# Patient Record
Sex: Female | Born: 1970 | Race: White | Hispanic: No | Marital: Married | Smoking: Current every day smoker
Health system: Southern US, Academic
[De-identification: ages and names within clinical notes are randomized; demographics above are authoritative.]

## PROBLEM LIST (undated history)

## (undated) DIAGNOSIS — E119 Type 2 diabetes mellitus without complications: Secondary | ICD-10-CM

## (undated) DIAGNOSIS — C801 Malignant (primary) neoplasm, unspecified: Secondary | ICD-10-CM

## (undated) DIAGNOSIS — K219 Gastro-esophageal reflux disease without esophagitis: Secondary | ICD-10-CM

## (undated) DIAGNOSIS — G43909 Migraine, unspecified, not intractable, without status migrainosus: Secondary | ICD-10-CM

## (undated) DIAGNOSIS — J45909 Unspecified asthma, uncomplicated: Secondary | ICD-10-CM

## (undated) HISTORY — DX: Type 2 diabetes mellitus without complications: E11.9

## (undated) HISTORY — DX: Malignant (primary) neoplasm, unspecified: C80.1

## (undated) HISTORY — DX: Migraine, unspecified, not intractable, without status migrainosus: G43.909

## (undated) HISTORY — PX: LAPAROSCOPIC CHOLECYSTECTOMY: SUR755

## (undated) HISTORY — DX: Unspecified asthma, uncomplicated: J45.909

## (undated) HISTORY — DX: Gastro-esophageal reflux disease without esophagitis: K21.9

## (undated) HISTORY — PX: SKIN CANCER EXCISION: SHX779

---

## 2015-07-16 ENCOUNTER — Emergency Department (HOSPITAL_COMMUNITY): Payer: Self-pay | Admitting: Emergency Medicine

## 2022-07-16 ENCOUNTER — Ambulatory Visit (INDEPENDENT_AMBULATORY_CARE_PROVIDER_SITE_OTHER): Payer: BC Managed Care – PPO | Admitting: OTOLARYNGOLOGY

## 2022-07-16 ENCOUNTER — Encounter (INDEPENDENT_AMBULATORY_CARE_PROVIDER_SITE_OTHER): Payer: Self-pay | Admitting: OTOLARYNGOLOGY

## 2022-07-16 ENCOUNTER — Other Ambulatory Visit: Payer: Self-pay

## 2022-07-16 VITALS — Ht 60.0 in | Wt 170.0 lb

## 2022-07-16 DIAGNOSIS — H9193 Unspecified hearing loss, bilateral: Secondary | ICD-10-CM

## 2022-07-16 DIAGNOSIS — H6983 Other specified disorders of Eustachian tube, bilateral: Secondary | ICD-10-CM

## 2022-07-16 DIAGNOSIS — H6521 Chronic serous otitis media, right ear: Secondary | ICD-10-CM

## 2022-07-16 DIAGNOSIS — H9201 Otalgia, right ear: Secondary | ICD-10-CM

## 2022-07-16 MED ORDER — AZELASTINE 137 MCG (0.1 %) NASAL SPRAY AEROSOL
2.00 | INHALATION_SPRAY | Freq: Two times a day (BID) | NASAL | 5 refills | Status: AC
Start: 2022-07-16 — End: ?

## 2022-07-16 NOTE — Procedures (Signed)
ENT, [place]  [address]  [place] [address]    Procedure Note    Name: Bethany Chavez MRN:  [id number]   Date: 07/16/2022 Age: 51 y.o.       [address] - NASAL ENDOSCOPY DIAGNOSTIC UNILATERAL OR BILATERAL (AMB ONLY)  Performed by: Gillermo Murdoch, DO  Authorized by: Gillermo Murdoch, DO     Time Out:     Immediately before the procedure, a time out was called:  Yes    Patient verified:  Yes    Procedure Verified:  Yes    Site Verified:  Yes  Indications for procedure: Otalgia and Unilateral middle ear effusion    Anesthesia: Oxymetazoline nasal spray    Description: Nasal endoscopy with rigid scope was performed with examination of the  septum, inferior, middle, and superior meatus, turbinates, sphenoethmoidal recess, and nasopharynx.     There were no polyps, pus, or granulation tissue noted.  ET orifices and nasopharynx were normal.     Findings: Septal deviation, Inferior turbinate hypertrophy, and Allergic rhinitis    The patient tolerated the procedure well.        2  Gillermo Murdoch, DO

## 2022-07-16 NOTE — H&P (Signed)
[facility], [place]  [place] [address]  Phone: [phone]  Fax: [phone]      Encounter Date: 07/16/2022    Patient ID: KALLAN BISCHOFF  MRN: [id number]    DOB: Jul 31, 1971  Age: 51 y.o. female        Referring Provider:    No referring provider defined for this encounter.    Reason for Visit:   Chief Complaint   Patient presents with    Ear Problem(s)     New patient here for fluid/drainage in right ear since Oct 2022. Tried abx and steroids. Reports having RMT done in 2017-2018. Reports seeing 2 ENTs, one which punctured TM and suctioned out fluid but hole closed back up.        History of Present Illness:  Bethany Chavez is a 51 y.o. female who is referred for eval of ears. Pt has chronic ETD with h/o multiple BMT, c/o right otorrhea in Oct 2022, along with Strep. She saw some blood as well. The otorrhea has stopped but she cannot hear well. Asso with interm otalgia. Completed abx and steroids without resolution. She saw an ENT in [place] 2 months ago who rec sinus surgery , and said he couldn't place a tube d/t severe TM retraction. Had a Sinus CT at [facility], [place].     Tympanogram: AD Type B      AS Type A      Patient History:  There is no problem list on file for this patient.    Current Outpatient Medications   Medication Sig    albuterol sulfate (PROVENTIL OR VENTOLIN OR PROAIR) 90 mcg/actuation Inhalation oral inhaler Take 2 Puffs by mouth Every 4 hours as needed    azelastine (ASTELIN) 137 mcg (0.1 %) Nasal Aerosol, Spray Administer 2 Sprays into each nostril Twice daily Use in each nostril as directed    cetirizine (ZYRTEC) 10 mg Oral Tablet Take 1 Tablet (10 mg total) by mouth Once a day    EFFER-K 25 mEq Oral Tablet, Effervescent DISSOLVE 1 TABLET IN WATER AND drink TWICE DAILY WITH MEALS    famotidine (PEPCID) 40 mg Oral Tablet TAKE 1 TABLET BY MOUTH EVERY DAY AT BEDTIME FOR 30 DAYS.    gabapentin (NEURONTIN) 100 mg Oral Capsule TAKE 1  CAPSULE BY MOUTH EVERY 8 HOURS - MAY MAKE DROWSY increased dose    glipiZIDE (GLUCOTROL) 5 mg Oral Tablet TAKE 1 TABLET BY MOUTH TWICE DAILY AS DIRECTED    hydroCHLOROthiazide (HYDRODIURIL) 25 mg Oral Tablet Take 1 Tablet (25 mg total) by mouth Once a day as directed    lansoprazole (PREVACID) 30 mg Oral Capsule, Delayed Release(E.C.) TAKE 1 CAPSULE BY MOUTH EVERY DAY AS DIRECTED FOR 30 DAYS.    metFORMIN (GLUCOPHAGE) 500 mg Oral Tablet TAKE 1 TABLET BY MOUTH TWICE DAILY AS DIRECTED    montelukast (SINGULAIR) 10 mg Oral Tablet TAKE 1 TABLET EVERY DAY AS DIRECTED     Allergies   Allergen Reactions    Amoxicillin     Cephalexin     Cipro [Ciprofloxacin Hcl]     Risamine [Menthol-Zinc Oxide]     Wellbutrin [Bupropion]      Past Medical History:   Diagnosis Date    Asthma     Cancer (CMS HCC)     Diabetes mellitus (CMS [place])     Esophageal reflux     Migraine       Past Surgical History:   Procedure Laterality  Date    LAPAROSCOPIC CHOLECYSTECTOMY      SKIN CANCER EXCISION        Family Medical History:       Problem Relation (Age of Onset)    Cancer Other    Diabetes Other    Hypertension (High Blood Pressure) Other    Migraines Other    Thyroid Disease Other            Social History     Tobacco Use    Smoking status: Every Day     Packs/day: 2.00     Years: 39.00     Pack years: 78.00     Types: Cigarettes    Smokeless tobacco: Never   Substance Use Topics    Alcohol use: Not Currently    Drug use: Never       Review of Systems:  Review of Systems    Physical Exam:  Ht 1.524 m (5')   Wt 77.1 kg (170 lb)   BMI 33.20 kg/m       Physical Exam  Constitutional:       Appearance: Normal appearance. She is well-developed, well-groomed and normal weight.   HENT:      Head: Normocephalic and atraumatic.      Right Ear: Hearing, ear canal and external ear normal. A middle ear effusion is present. Tympanic membrane is retracted.      Left Ear: Hearing, ear canal and external ear normal. Tympanic membrane is scarred.      Nose:  Septal deviation and mucosal edema present.      Right Turbinates: Enlarged.      Left Turbinates: Enlarged.      Mouth/Throat:      Lips: Pink.      Mouth: Mucous membranes are moist.      Pharynx: Oropharynx is clear. Uvula midline.   Eyes:      Extraocular Movements: Extraocular movements intact.   Neck:      Trachea: Phonation normal.   Pulmonary:      Effort: Pulmonary effort is normal.   Musculoskeletal:      Cervical back: Normal range of motion and neck supple.   Lymphadenopathy:      Cervical: No cervical adenopathy.   Skin:     General: Skin is warm.   Neurological:      Mental Status: She is alert and oriented to person, place, and time.      Cranial Nerves: Cranial nerves 2-12 are intact. No facial asymmetry.   Psychiatric:         Attention and Perception: Attention normal.         Mood and Affect: Mood normal.         Speech: Speech normal.         Behavior: Behavior normal. Behavior is cooperative.       Assessment:  ENCOUNTER DIAGNOSES     ICD-10-CM   1. ETD (Eustachian tube dysfunction), bilateral  H69.83   2. Right chronic serous otitis media  H65.21   3. Unspecified hearing loss, bilateral  H91.93   4. Otalgia of right ear  H92.01       Plan:  Medical records reviewed on 07/16/2022.  Will order audiogram. Discussed RMT. Requested Sinus CT reports.   Rx Astelin nasal spray  Nasal saline b.i.d.  Orders Placed This Encounter    [phone] - NASAL ENDOSCOPY DIAGNOSTIC UNILATERAL OR BILATERAL (AMB ONLY)    AMB PRN REFERRAL EXTERNAL AUDIOLOGIST    POCT HEARING/VISION/TYMPANOGRAM (  AMB ONLY)    azelastine (ASTELIN) 137 mcg (0.1 %) Nasal Aerosol, Spray     Return for Review of audiogram and in office right myringotomy with tube placement on next procedure day.    The advanced practice clinician's documentation was reviewed/amended in its entirety with the assessment and plan portion completely performed independently by me during this encounter.    Gillermo Murdoch, DO      Bethany Dad, PA-C  07/16/2022, 09:58

## 2022-07-21 ENCOUNTER — Telehealth (INDEPENDENT_AMBULATORY_CARE_PROVIDER_SITE_OTHER): Payer: Self-pay | Admitting: OTOLARYNGOLOGY

## 2022-07-21 NOTE — Telephone Encounter (Signed)
Lm for pt to return call this morning. Lm for pt to return call just now on number listed in chart

## 2022-07-21 NOTE — Telephone Encounter (Signed)
Spoke to patient, she has insurance taken care of. Scheduled appt for audio and tube. Unable to do 8/25 or 8/28

## 2022-07-21 NOTE — Telephone Encounter (Signed)
[phone] said she never received a call concerning tube being placed

## 2022-08-01 ENCOUNTER — Encounter (INDEPENDENT_AMBULATORY_CARE_PROVIDER_SITE_OTHER): Payer: Self-pay | Admitting: OTOLARYNGOLOGY

## 2022-08-01 ENCOUNTER — Other Ambulatory Visit: Payer: Self-pay

## 2022-08-01 ENCOUNTER — Ambulatory Visit (INDEPENDENT_AMBULATORY_CARE_PROVIDER_SITE_OTHER): Payer: BC Managed Care – PPO | Admitting: OTOLARYNGOLOGY

## 2022-08-01 VITALS — Resp 16 | Wt 170.0 lb

## 2022-08-01 DIAGNOSIS — H9201 Otalgia, right ear: Secondary | ICD-10-CM

## 2022-08-01 DIAGNOSIS — H906 Mixed conductive and sensorineural hearing loss, bilateral: Secondary | ICD-10-CM

## 2022-08-01 DIAGNOSIS — H6521 Chronic serous otitis media, right ear: Secondary | ICD-10-CM

## 2022-08-01 DIAGNOSIS — J3089 Other allergic rhinitis: Secondary | ICD-10-CM

## 2022-08-01 DIAGNOSIS — H6983 Other specified disorders of Eustachian tube, bilateral: Secondary | ICD-10-CM

## 2022-08-01 MED ORDER — OFLOXACIN 0.3 % EAR DROPS
5.0000 [drp] | Freq: Two times a day (BID) | OTIC | 0 refills | Status: DC
Start: 2022-08-01 — End: 2023-07-27

## 2022-08-01 NOTE — Procedures (Signed)
ENT, [place]  [address]  [place] [address]    Procedure Note    Name: ALEXYA MCDARIS MRN:  [id number]   Date: 08/01/2022 Age: 51 y.o.       [id number] - TYMPANOSTOMY (REQUIRING INSERTION OF VENTILATING TUBE), LOCAL/TOPICAL ANESTHESIA (AMB ONLY)  Performed by: Gillermo Murdoch, DO  Authorized by: Gillermo Murdoch, DO     Time Out:     Immediately before the procedure, a time out was called:  Yes    Patient verified:  Yes    Procedure Verified:  Yes    Site Verified:  Yes  Preoperative Diagnosis:  Right eustachian tube dysfunction, right chronic otitis media      Postoperative Diagnoses:  Same     Procedure Performed:  Right myringotomy with tube placement     Attending Surgeon: Gillermo Murdoch, DO      Anesthesia Type:  Topical/local     Estimated Blood Loss:  Minimal     Ventilation tube type:U Tube      Complications:  None immediate        Description of Procedure:  After informed consent was reviewed and confirmed. Patient was prepped and draped in a standard semi-sterile ENT fashion. With the aid of a binocular microscope the right external auditory canal was examined after placement of a ear speculum, a cerumen curette was used to debride obstructing cerumen. The visualized tympanic membrane revealed findings consistent with the after mentioned diagnosis. An anterior inferior radial incision was made using a myringotomy knife. Middle ear fluid was suctioned free with a #3 suction. The myringotomy tube was placed within the myringotomy incision with the alligator forceps. Floxin otic drops were instilled into the EAC, middle ear penetration was aided with a tragal pump. A cottonball was then placed into the EAC.  Patient tolerated procedure well         Gillermo Murdoch, DO

## 2022-08-01 NOTE — H&P (Signed)
ENT, [place]  [place] [address]  Phone: [phone]  Fax: [phone]      Encounter Date: 08/01/2022    Patient ID: Bethany Chavez  MRN: [id number]    DOB: 1971/03/18  Age: 51 y.o. female        Referring Provider:    No referring provider defined for this encounter.    Reason for Visit:   Chief Complaint   Patient presents with    Follow-up on ears and allergies             History of Present Illness:  Bethany Chavez is a 51 y.o. female who presents for follow-up on ears and allergies.  Has a recap, Pt has chronic ETD with h/o multiple BMT.  She saw another ENT the recommend a right myringotomy with tube placement however he was unable to place a due to severe retraction.  She is continued to have problems with that ear with fluid and decreased hearing and intermittent otalgia.  Increased allergy symptoms    Tympanogram: AD Type B      AS Type A    Audiogram reviewed:  Showing a mixed hearing loss on the right          Patient History:  There is no problem list on file for this patient.    Current Outpatient Medications   Medication Sig    albuterol sulfate (PROVENTIL OR VENTOLIN OR PROAIR) 90 mcg/actuation Inhalation oral inhaler Take 2 Puffs by mouth Every 4 hours as needed    azelastine (ASTELIN) 137 mcg (0.1 %) Nasal Aerosol, Spray Administer 2 Sprays into each nostril Twice daily Use in each nostril as directed    cetirizine (ZYRTEC) 10 mg Oral Tablet Take 1 Tablet (10 mg total) by mouth Once a day    EFFER-K 25 mEq Oral Tablet, Effervescent DISSOLVE 1 TABLET IN WATER AND drink TWICE DAILY WITH MEALS    famotidine (PEPCID) 40 mg Oral Tablet TAKE 1 TABLET BY MOUTH EVERY DAY AT BEDTIME FOR 30 DAYS.    gabapentin (NEURONTIN) 100 mg Oral Capsule TAKE 1 CAPSULE BY MOUTH EVERY 8 HOURS - MAY MAKE DROWSY increased dose    glipiZIDE (GLUCOTROL) 5 mg Oral Tablet TAKE 1 TABLET BY MOUTH TWICE DAILY AS DIRECTED    hydroCHLOROthiazide (HYDRODIURIL) 25 mg Oral Tablet Take 1  Tablet (25 mg total) by mouth Once a day as directed    lansoprazole (PREVACID) 30 mg Oral Capsule, Delayed Release(E.C.) TAKE 1 CAPSULE BY MOUTH EVERY DAY AS DIRECTED FOR 30 DAYS.    metFORMIN (GLUCOPHAGE) 500 mg Oral Tablet TAKE 1 TABLET BY MOUTH TWICE DAILY AS DIRECTED    montelukast (SINGULAIR) 10 mg Oral Tablet TAKE 1 TABLET EVERY DAY AS DIRECTED    ofloxacin (FLOXIN) 0.3 % Otic Drops Instill 5 Drops into both ears Twice daily for 7 days     Allergies   Allergen Reactions    Amoxicillin     Cephalexin     Cipro [Ciprofloxacin Hcl]     Risamine [Menthol-Zinc Oxide]     Wellbutrin [Bupropion]      Past Medical History:   Diagnosis Date    Asthma     Cancer (CMS HCC)     Diabetes mellitus (CMS [place])     Esophageal reflux     Migraine       Past Surgical History:   Procedure Laterality Date    LAPAROSCOPIC CHOLECYSTECTOMY      SKIN CANCER EXCISION  Family Medical History:       Problem Relation (Age of Onset)    Cancer Other    Diabetes Other    Hypertension (High Blood Pressure) Other    Migraines Other    Thyroid Disease Other            Social History     Tobacco Use    Smoking status: Every Day     Packs/day: 2.00     Years: 39.00     Pack years: 78.00     Types: Cigarettes    Smokeless tobacco: Never   Substance Use Topics    Alcohol use: Not Currently    Drug use: Never       Review of Systems:  Review of Systems    Physical Exam:  Resp 16   Wt 77.1 kg (170 lb)   BMI 33.20 kg/m       Physical Exam  Constitutional:       Appearance: Normal appearance. She is well-developed, well-groomed and normal weight.   HENT:      Head: Normocephalic and atraumatic.      Right Ear: Hearing, ear canal and external ear normal. A middle ear effusion is present. Tympanic membrane is retracted.      Left Ear: Hearing, ear canal and external ear normal. Tympanic membrane is scarred.      Nose: Septal deviation and mucosal edema present.      Right Turbinates: Enlarged.      Left Turbinates: Enlarged.      Mouth/Throat:       Lips: Pink.      Mouth: Mucous membranes are moist.      Pharynx: Oropharynx is clear. Uvula midline.   Eyes:      Extraocular Movements: Extraocular movements intact.   Neck:      Trachea: Phonation normal.   Pulmonary:      Effort: Pulmonary effort is normal.   Musculoskeletal:      Cervical back: Normal range of motion and neck supple.   Lymphadenopathy:      Cervical: No cervical adenopathy.   Skin:     General: Skin is warm.   Neurological:      Mental Status: She is alert and oriented to person, place, and time.      Cranial Nerves: Cranial nerves 2-12 are intact. No facial asymmetry.   Psychiatric:         Attention and Perception: Attention normal.         Mood and Affect: Mood normal.         Speech: Speech normal.         Behavior: Behavior normal. Behavior is cooperative.       Assessment:  ENCOUNTER DIAGNOSES     ICD-10-CM   1. ETD (Eustachian tube dysfunction), bilateral  H69.83   2. Right chronic serous otitis media  H65.21   3. 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 Otalgia of right ear  H92.01   5. Non-seasonal allergic rhinitis due to other allergic trigger  J30.89       Plan:  Medical records reviewed on 08/01/2022.  Audiogram and tympanometry reviewed and consistent with chronic serous otitis media of the right ear  In office right myringotomy with tube placement performed.  Postprocedure instructions reviewed.  Rx ofloxacin otic  C/w Astelin nasal spray  Nasal saline b.i.d.  We will order repeat audiogram prior to next visit  Orders Placed This  Encounter    [phone] - TYMPANOSTOMY (REQUIRING INSERTION OF VENTILATING TUBE), LOCAL/TOPICAL ANESTHESIA (AMB ONLY)    [facility]    POCT HEARING/VISION/TYMPANOGRAM (AMB ONLY)    ofloxacin (FLOXIN) 0.3 % Otic Drops     Return in about 1 month (around 08/31/2022).        Gillermo Murdoch, DO

## 2022-08-14 ENCOUNTER — Telehealth (INDEPENDENT_AMBULATORY_CARE_PROVIDER_SITE_OTHER): Payer: Self-pay | Admitting: OTOLARYNGOLOGY

## 2022-08-14 MED ORDER — AZITHROMYCIN 250 MG TABLET
ORAL_TABLET | ORAL | 0 refills | Status: DC
Start: 2022-08-14 — End: 2023-03-03

## 2022-08-14 NOTE — Telephone Encounter (Signed)
Patient called, states since having RMT 08/01/22 having ear pain that goes down ear and into back of neck. Hurts to turn her head. Asking for abx. Spoke with Dr. Ginny Forth, informed of abx being sent and to keep f/u appt

## 2022-09-03 ENCOUNTER — Other Ambulatory Visit: Payer: Self-pay

## 2022-09-03 ENCOUNTER — Ambulatory Visit (INDEPENDENT_AMBULATORY_CARE_PROVIDER_SITE_OTHER): Payer: BC Managed Care – PPO | Admitting: OTOLARYNGOLOGY

## 2022-09-03 ENCOUNTER — Encounter (INDEPENDENT_AMBULATORY_CARE_PROVIDER_SITE_OTHER): Payer: Self-pay | Admitting: OTOLARYNGOLOGY

## 2022-09-03 VITALS — Wt 170.0 lb

## 2022-09-03 DIAGNOSIS — Z4589 Encounter for adjustment and management of other implanted devices: Secondary | ICD-10-CM

## 2022-09-03 DIAGNOSIS — H9201 Otalgia, right ear: Secondary | ICD-10-CM

## 2022-09-03 DIAGNOSIS — K219 Gastro-esophageal reflux disease without esophagitis: Secondary | ICD-10-CM

## 2022-09-03 DIAGNOSIS — H6993 Unspecified Eustachian tube disorder, bilateral: Secondary | ICD-10-CM

## 2022-09-03 DIAGNOSIS — J3089 Other allergic rhinitis: Secondary | ICD-10-CM

## 2022-09-03 DIAGNOSIS — H906 Mixed conductive and sensorineural hearing loss, bilateral: Secondary | ICD-10-CM

## 2022-09-03 NOTE — H&P (Cosign Needed)
ENT, [place]  [place] [address]  Phone: [phone]  Fax: [phone]      Encounter Date: 09/03/2022    Patient ID: Bethany Chavez  MRN: [id number]    DOB: 1971-01-19  Age: 51 y.o. female     Progress Note       Referring Provider:  No ref. provider found    Reason for Visit:   Chief Complaint   Patient presents with    Ear Tube     Patient here for 1 month follow up on RMT. Audio done        History of Present Illness:  Bethany Chavez is a 51 y.o. female who is FU on chronic ETD, RMT. Pt states hearing has improved since RMT. Denies otorrhea, otalgia.     Audiogram: AD Mild CHL, Type B/Inc CV     AS WNL, Type A  Tympanogram: AD CNS    AS Type A      Patient History:  There is no problem list on file for this patient.    Current Outpatient Medications   Medication Sig    albuterol sulfate (PROVENTIL OR VENTOLIN OR PROAIR) 90 mcg/actuation Inhalation oral inhaler Take 2 Puffs by mouth Every 4 hours as needed    azelastine (ASTELIN) 137 mcg (0.1 %) Nasal Aerosol, Spray Administer 2 Sprays into each nostril Twice daily Use in each nostril as directed    azithromycin (ZITHROMAX) 250 mg Oral Tablet Take 500 mg (2 tab) on day 1; take 250 mg (1 tab) on days 2-5.    cetirizine (ZYRTEC) 10 mg Oral Tablet Take 1 Tablet (10 mg total) by mouth Once a day    EFFER-K 25 mEq Oral Tablet, Effervescent DISSOLVE 1 TABLET IN WATER AND drink TWICE DAILY WITH MEALS    famotidine (PEPCID) 40 mg Oral Tablet TAKE 1 TABLET BY MOUTH EVERY DAY AT BEDTIME FOR 30 DAYS.    gabapentin (NEURONTIN) 100 mg Oral Capsule TAKE 1 CAPSULE BY MOUTH EVERY 8 HOURS - MAY MAKE DROWSY increased dose    glipiZIDE (GLUCOTROL) 5 mg Oral Tablet TAKE 1 TABLET BY MOUTH TWICE DAILY AS DIRECTED    hydroCHLOROthiazide (HYDRODIURIL) 25 mg Oral Tablet Take 1 Tablet (25 mg total) by mouth Once a day as directed    lansoprazole (PREVACID) 30 mg Oral Capsule, Delayed Release(E.C.) TAKE 1 CAPSULE BY MOUTH EVERY DAY AS DIRECTED  FOR 30 DAYS.    metFORMIN (GLUCOPHAGE) 500 mg Oral Tablet TAKE 1 TABLET BY MOUTH TWICE DAILY AS DIRECTED    montelukast (SINGULAIR) 10 mg Oral Tablet TAKE 1 TABLET EVERY DAY AS DIRECTED      Allergies   Allergen Reactions    Amoxicillin     Cephalexin     Cipro [Ciprofloxacin Hcl]     Risamine [Menthol-Zinc Oxide]     Wellbutrin [Bupropion]      Past Medical History:   Diagnosis Date    Asthma     Cancer (CMS HCC)     Diabetes mellitus (CMS [place])     Esophageal reflux     Migraine      Past Surgical History:   Procedure Laterality Date    LAPAROSCOPIC CHOLECYSTECTOMY      SKIN CANCER EXCISION       Family Medical History:       Problem Relation (Age of Onset)    Cancer Other    Diabetes Other    Hypertension (High Blood Pressure) Other  Migraines Other    Thyroid Disease Other            Social History     Tobacco Use    Smoking status: Every Day     Packs/day: 2.00     Years: 39.00     Additional pack years: 0.00     Total pack years: 78.00     Types: Cigarettes    Smokeless tobacco: Never   Substance Use Topics    Alcohol use: Not Currently    Drug use: Never       Review of Systems:  Review of Systems    Physical Exam:  Wt 77.1 kg (170 lb)   BMI 33.20 kg/m       Physical Exam  Constitutional:       Appearance: Normal appearance. She is well-developed, well-groomed and normal weight.   HENT:      Head: Normocephalic and atraumatic.      Right Ear: Hearing, ear canal and external ear normal. A PE tube is present.      Left Ear: Hearing, ear canal and external ear normal. Tympanic membrane is scarred.      Ears:      Comments: AD TIPP     Nose: Septal deviation and mucosal edema present.      Right Turbinates: Enlarged.      Left Turbinates: Enlarged.      Mouth/Throat:      Lips: Pink.      Mouth: Mucous membranes are moist.      Pharynx: Oropharynx is clear. Uvula midline.   Eyes:      Extraocular Movements: Extraocular movements intact.   Neck:      Trachea: Phonation normal.   Pulmonary:      Effort:  Pulmonary effort is normal.   Musculoskeletal:      Cervical back: Normal range of motion and neck supple.   Lymphadenopathy:      Cervical: No cervical adenopathy.   Skin:     General: Skin is warm.   Neurological:      Mental Status: She is alert and oriented to person, place, and time.      Cranial Nerves: Cranial nerves 2-12 are intact. No facial asymmetry.   Psychiatric:         Attention and Perception: Attention normal.         Mood and Affect: Mood normal.         Speech: Speech normal.         Behavior: Behavior normal. Behavior is cooperative.         Assessment:  ENCOUNTER DIAGNOSES     ICD-10-CM   1. ETD (Eustachian tube dysfunction), bilateral  H69.93   2. Non-seasonal allergic rhinitis due to other allergic trigger  J30.89   3. Tympanostomy tube check  Z45.89       Plan:  Medical records reviewed on 09/03/2022.  AD TIPP. Audiogram reviewed showing improvement in hearing on right.   No orders of the defined types were placed in this encounter.    No follow-ups on file.    Mathis Dad, PA-C  09/03/2022, 10:06

## 2023-03-03 ENCOUNTER — Other Ambulatory Visit: Payer: Self-pay

## 2023-03-03 ENCOUNTER — Ambulatory Visit (INDEPENDENT_AMBULATORY_CARE_PROVIDER_SITE_OTHER): Payer: BC Managed Care – PPO | Admitting: OTOLARYNGOLOGY

## 2023-03-03 ENCOUNTER — Encounter (INDEPENDENT_AMBULATORY_CARE_PROVIDER_SITE_OTHER): Payer: Self-pay | Admitting: OTOLARYNGOLOGY

## 2023-03-03 VITALS — Ht 60.0 in | Wt 170.0 lb

## 2023-03-03 DIAGNOSIS — K219 Gastro-esophageal reflux disease without esophagitis: Secondary | ICD-10-CM

## 2023-03-03 DIAGNOSIS — J3089 Other allergic rhinitis: Secondary | ICD-10-CM

## 2023-03-03 DIAGNOSIS — M26609 Unspecified temporomandibular joint disorder, unspecified side: Secondary | ICD-10-CM

## 2023-03-03 DIAGNOSIS — H9201 Otalgia, right ear: Secondary | ICD-10-CM

## 2023-03-03 DIAGNOSIS — H6993 Unspecified Eustachian tube disorder, bilateral: Secondary | ICD-10-CM

## 2023-03-03 DIAGNOSIS — Z4589 Encounter for adjustment and management of other implanted devices: Secondary | ICD-10-CM

## 2023-03-03 NOTE — H&P (Signed)
[facility]  [address]  [place] [address]  Phone: [phone]  Fax: [phone]      Encounter Date: 03/03/2023    Patient ID: Bethany Chavez  MRN: [id number]    DOB: 1971-07-25  Age: 52 y.o. female     Progress Note       Referring Provider:  No ref. provider found    Reason for Visit:   Chief Complaint   Patient presents with    Follow Up 6 Months     RMT 08/02/22, no c/o, occ pain but otherwise fine        History of Present Illness:  Bethany Chavez is a 52 y.o. female who  is FU on chronic ETD, s/p RMT, AR, GERD.  Gets occasional right otalgia but otherwise doing well.  Denies otorrhea    Tympanogram: AD Type B/Inc CV      AS Type A      Patient History:  There is no problem list on file for this patient.    Current Outpatient Medications   Medication Sig    albuterol sulfate (PROVENTIL OR VENTOLIN OR PROAIR) 90 mcg/actuation Inhalation oral inhaler Take 2 Puffs by mouth Every 4 hours as needed    atorvastatin (LIPITOR) 20 mg Oral Tablet TAKE 1 TABLET BY MOUTH EVERY DAY (DIFFERENT BRAND)    azelastine (ASTELIN) 137 mcg (0.1 %) Nasal Aerosol, Spray Administer 2 Sprays into each nostril Twice daily Use in each nostril as directed    cetirizine (ZYRTEC) 10 mg Oral Tablet Take 1 Tablet (10 mg total) by mouth Once a day    EFFER-K 25 mEq Oral Tablet, Effervescent DISSOLVE 1 TABLET IN WATER AND drink TWICE DAILY WITH MEALS    famotidine (PEPCID) 40 mg Oral Tablet TAKE 1 TABLET BY MOUTH EVERY DAY AT BEDTIME FOR 30 DAYS.    gabapentin (NEURONTIN) 100 mg Oral Capsule TAKE 1 CAPSULE BY MOUTH EVERY 8 HOURS - MAY MAKE DROWSY increased dose    glipiZIDE (GLUCOTROL) 5 mg Oral Tablet TAKE 1 TABLET BY MOUTH TWICE DAILY AS DIRECTED    hydroCHLOROthiazide (HYDRODIURIL) 25 mg Oral Tablet Take 1 Tablet (25 mg total) by mouth Once a day as directed    lansoprazole (PREVACID) 30 mg Oral Capsule, Delayed Release(E.C.) TAKE 1 CAPSULE BY MOUTH EVERY DAY AS DIRECTED FOR 30 DAYS.    metFORMIN  (GLUCOPHAGE) 500 mg Oral Tablet TAKE 1 TABLET BY MOUTH TWICE DAILY AS DIRECTED    montelukast (SINGULAIR) 10 mg Oral Tablet TAKE 1 TABLET EVERY DAY AS DIRECTED    ofloxacin (FLOXIN) 0.3 % Otic Drops Otic Solution Administer 4 Drops into the right ear Twice daily for 14 days      Allergies   Allergen Reactions    Amoxicillin     Cephalexin     Cipro [Ciprofloxacin Hcl]     Risamine [Menthol-Zinc Oxide]     Wellbutrin [Bupropion]      Past Medical History:   Diagnosis Date    Asthma     Cancer (CMS HCC)     Diabetes mellitus (CMS HCC)     Esophageal reflux     Migraine      Past Surgical History:   Procedure Laterality Date    LAPAROSCOPIC CHOLECYSTECTOMY      SKIN CANCER EXCISION       Family Medical History:       Problem Relation (Age of Onset)    Cancer Other    Diabetes Other  Hypertension (High Blood Pressure) Other    Migraines Other    Thyroid Disease Other            Social History     Tobacco Use    Smoking status: Every Day     Current packs/day: 2.00     Average packs/day: 2.0 packs/day for 39.0 years (78.0 ttl pk-yrs)     Types: Cigarettes    Smokeless tobacco: Never   Substance Use Topics    Alcohol use: Not Currently    Drug use: Never       Review of Systems:  Review of Systems    Physical Exam:  Ht 1.524 m (5')   Wt 77.1 kg (170 lb)   BMI 33.20 kg/m       Physical Exam  Constitutional:       Appearance: Normal appearance. She is well-developed, well-groomed and normal weight.   HENT:      Head: Normocephalic and atraumatic.      Right Ear: Hearing, ear canal and external ear normal. A PE tube is present.      Left Ear: Hearing, ear canal and external ear normal. Tympanic membrane is scarred.      Ears:      Comments: AD TIPP     Nose: Septal deviation and mucosal edema present.      Right Turbinates: Enlarged.      Left Turbinates: Enlarged.      Mouth/Throat:      Lips: Pink.      Mouth: Mucous membranes are moist.      Pharynx: Oropharynx is clear. Uvula midline.   Eyes:      Extraocular  Movements: Extraocular movements intact.   Neck:      Trachea: Phonation normal.   Pulmonary:      Effort: Pulmonary effort is normal.   Musculoskeletal:      Cervical back: Normal range of motion and neck supple.   Lymphadenopathy:      Cervical: No cervical adenopathy.   Skin:     General: Skin is warm.   Neurological:      Mental Status: She is alert and oriented to person, place, and time.      Cranial Nerves: Cranial nerves 2-12 are intact. No facial asymmetry.   Psychiatric:         Attention and Perception: Attention normal.         Mood and Affect: Mood normal.         Speech: Speech normal.         Behavior: Behavior normal. Behavior is cooperative.         Assessment:  ENCOUNTER DIAGNOSES     ICD-10-CM   1. ETD (Eustachian tube dysfunction), bilateral  H69.93   2. Non-seasonal allergic rhinitis due to other allergic trigger  J30.89   3. Tympanostomy tube check  Z45.89   4. Otalgia of right ear  H92.01   5. Gastroesophageal reflux disease, unspecified whether esophagitis present  K21.9   6. TMJ (temporomandibular joint disorder)  M26.609       Plan:  Medical records reviewed on 03/03/2023.  AD TIPP  Continue Astelin, Zyrtec, Singulair daily  Nasal saline b.i.d.  Continue Prevacid and Pepcid daily  Reflux precautions/diet modifications  TMJ precautions  No orders of the defined types were placed in this encounter.    Follow-up in 4-6 months or sooner PRN    The advanced practice clinician's documentation was reviewed/amended in its entirety with the assessment  and plan portion completely performed independently by me during this encounter.   Lonia Farber, D.O., MMS        Marcelline Deist, PA-C  03/03/2023, 15:57

## 2023-03-05 IMAGING — MR MRI BRAIN WITHOUT AND WITH CONTRAST
10 of 11 series · 38 of 48 positions shown · IV contrast (gadavist)
Comparison: Report of outside MRI dated 04/14/2006 is available.  Actual images are not available from that study.

﻿EXAM:  MRI BRAIN WITHOUT AND WITH CONTRAST
INDICATION: 51-year-old diagnosed with hydrocephalus in 7047.  No history of surgery. Follow-up evaluation.
TECHNIQUE: Multiplanar, multisequential MRI of the brain was performed without and with 10 mL of Gadavist.

[Series 5: DWI · axial · 5.0mm · 1.35mm/px · z∈[-44,+82]mm · 9 of 88 slices shown (1 of 3)]
[im 1/88]
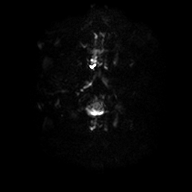
[im 15/88]
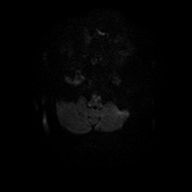
[im 30/88]
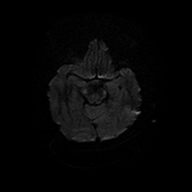
[im 37/88]
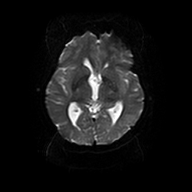
[im 44/88]
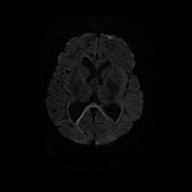
[im 51/88]
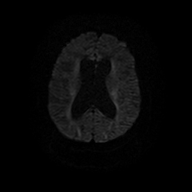
[im 59/88]
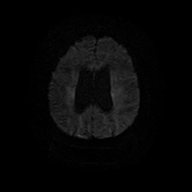
[im 73/88]
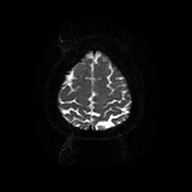
[im 88/88]
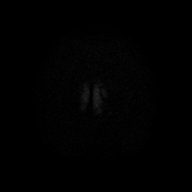

[Series 6: DWI · axial · 5.0mm · 1.35mm/px · z∈[-44,+82]mm · 3 of 22 slices shown (2 of 3)]
[im 1/22]
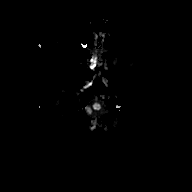
[im 11/22]
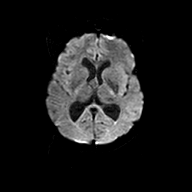
[im 22/22]
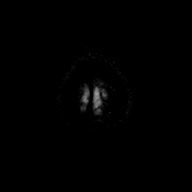

[Series 7: DWI · axial · 5.0mm · 1.35mm/px · z∈[-44,+82]mm · 3 of 22 slices shown (3 of 3)]
[im 1/22]
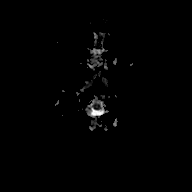
[im 11/22]
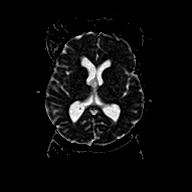
[im 22/22]
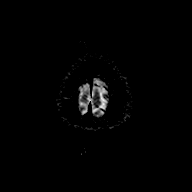

[Series 9: T2 · axial · 5.0mm · 0.43mm/px · z∈[-52,+86]mm · 4 of 24 slices shown (1 of 3)]
[im 1/24]
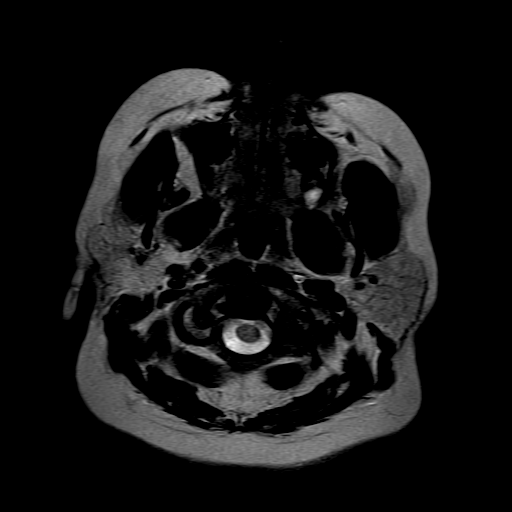
[im 8/24]
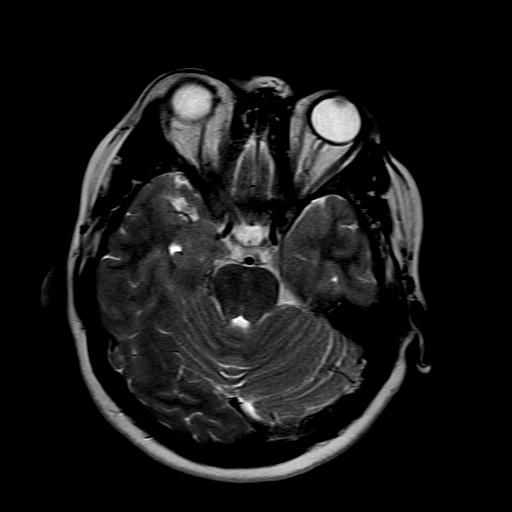
[im 16/24]
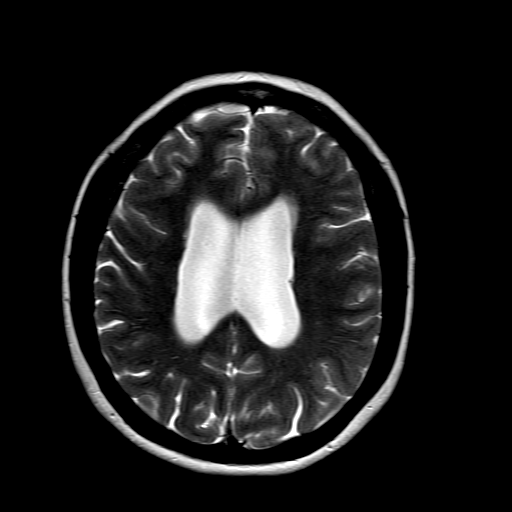
[im 24/24]
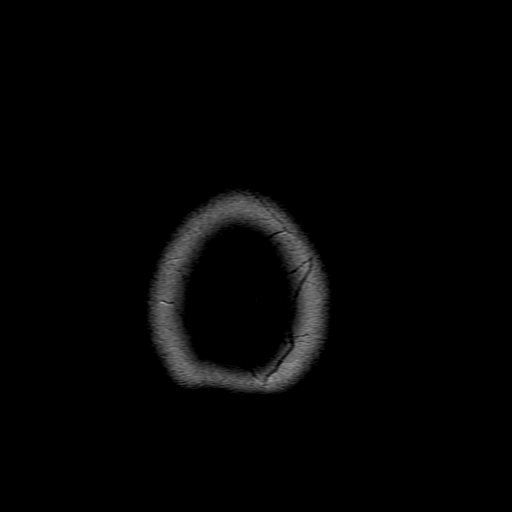

[Series 11: FLAIR · axial · 5.0mm · 0.76mm/px · z∈[-46,+80]mm · 3 of 22 slices shown]
[im 1/22]
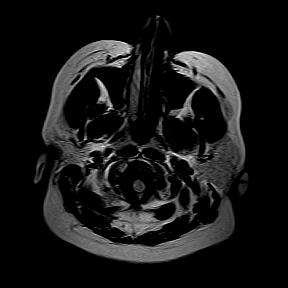
[im 11/22]
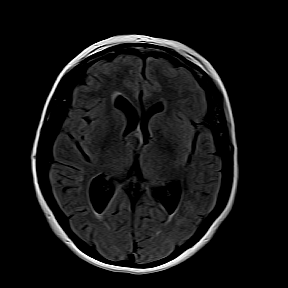
[im 22/22]
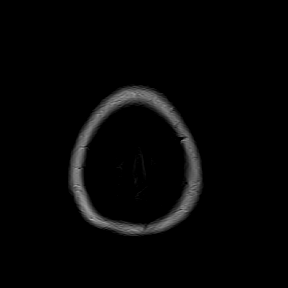

[Series 12: T1 · axial · 5.0mm · 0.69mm/px · z∈[-46,+80]mm · 3 of 22 slices shown]
[im 1/22]
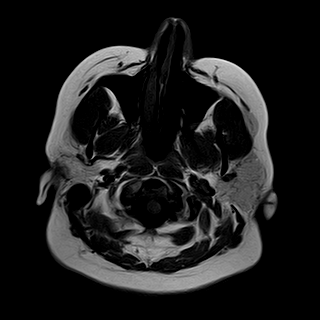
[im 11/22]
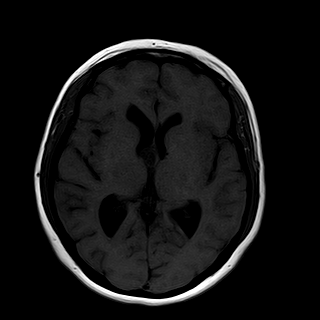
[im 22/22]
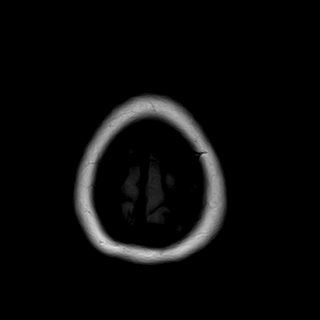

[Series 14: T2 · sagittal · 4.0mm · 0.75mm/px · 4 of 26 slices shown (2 of 3)]
[im 1/26]
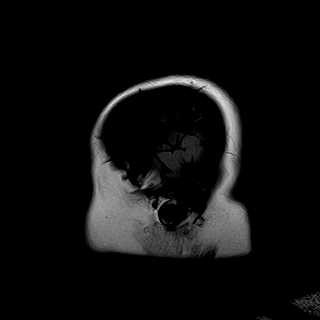
[im 9/26]
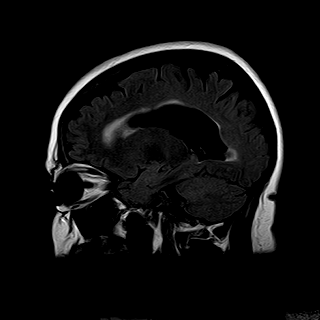
[im 17/26]
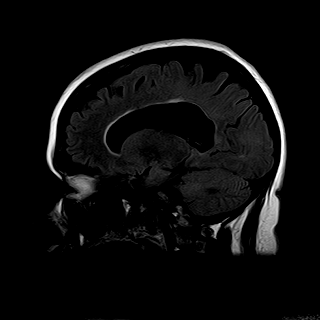
[im 26/26]
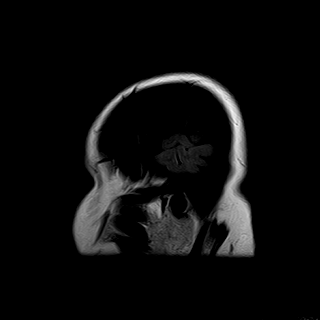

[Series 15: T2 · coronal · 6.0mm · 0.43mm/px · 4 of 24 slices shown (3 of 3)]
[im 1/24]
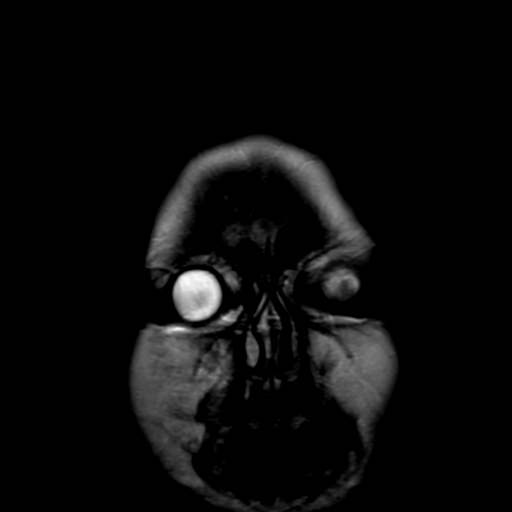
[im 8/24]
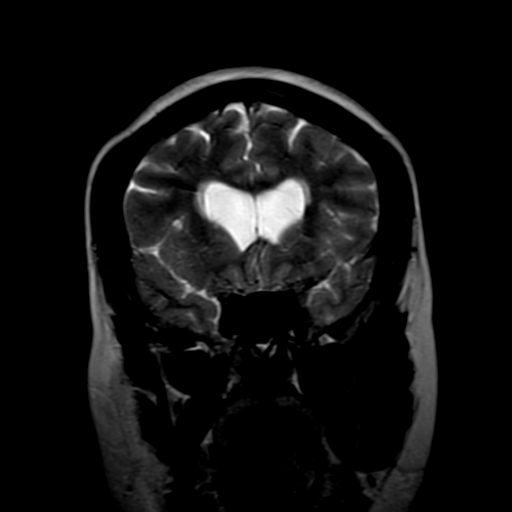
[im 16/24]
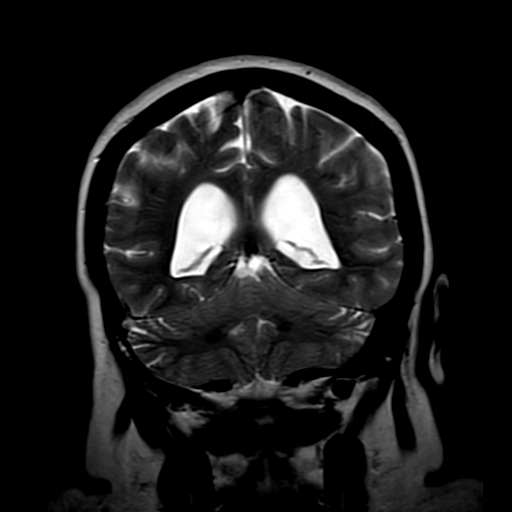
[im 24/24]
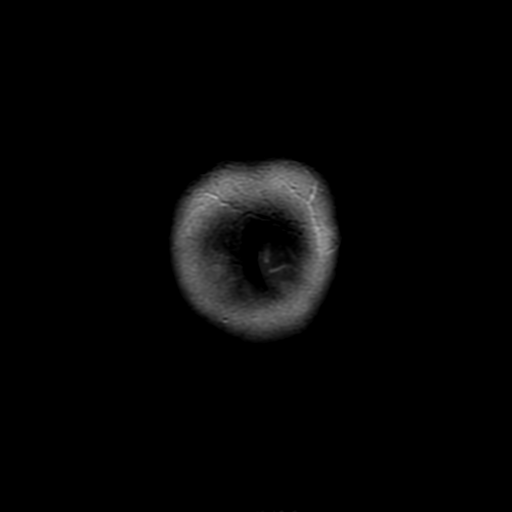

[Series 16: T1 post-contrast · axial · 5.0mm · 0.43mm/px · z∈[-55,+89]mm · 4 of 25 slices shown]
[im 1/25]
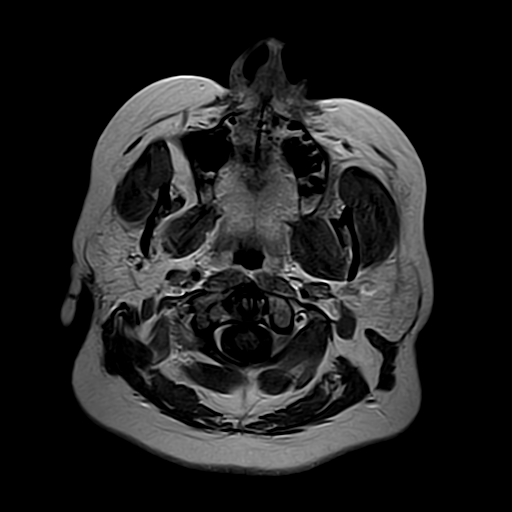
[im 9/25]
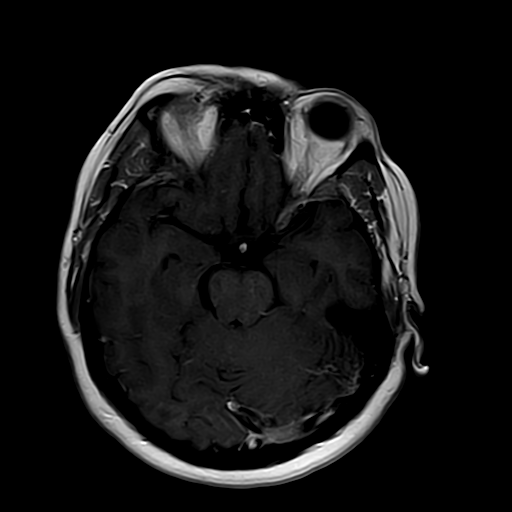
[im 17/25]
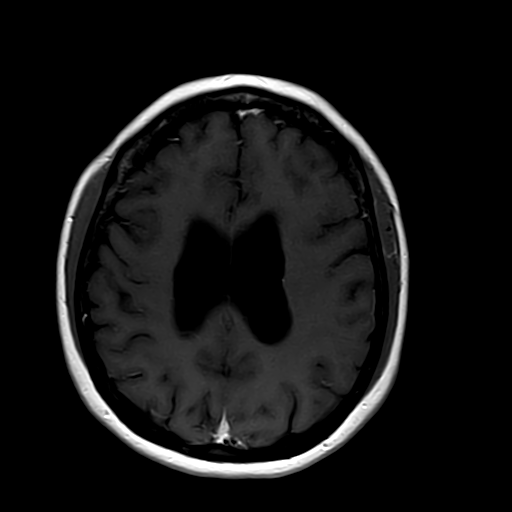
[im 25/25]
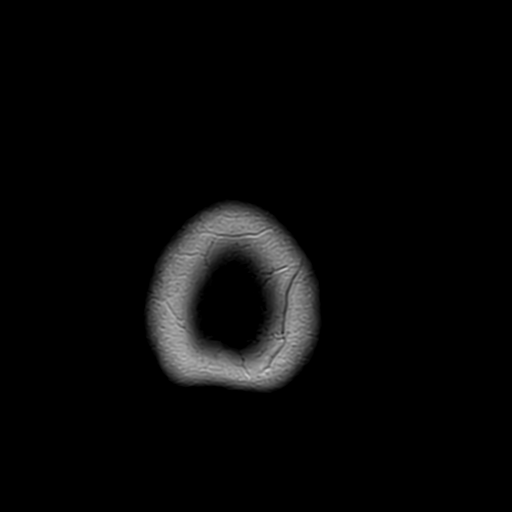

[Series 17: T1 fat-sat post-contrast · coronal · 5.0mm · 0.57mm/px · 1 of 24 slices shown]
[im 1/24]
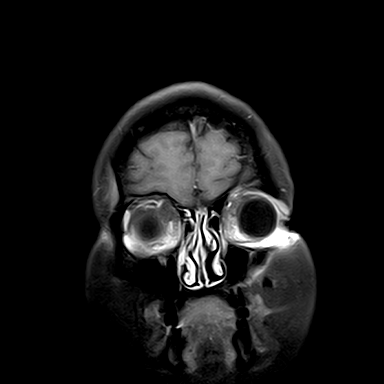

[38 of 48 positions shown; findings below may reference images not displayed]

FINDINGS: No acute ischemic change on the diffusion weighted sequence.  No intracranial bleed or extra-axial collections.

No intracranial space-occupying mass or abnormal enhancement on the postcontrast examination.  No evidence of meningeal enhancement.  Mild ventriculomegaly is noted with body of the lateral ventricles measuring transverse diameter of 22 mm on each side.  The 3rd ventricle measures transverse diameter of 7 mm.  Fourth ventricle is normal in size.

Pituitary gland is normal in appearance.  No evidence of demyelinating lesions of the brain are seen. No posterior fossa lesions are seen.
IMPRESSION: 1.  Mild ventriculomegaly involving lateral ventricles and the 3rd ventricle.  The aqueduct and 4th ventricle appear to be normal in size.  Assessment regarding any interval change from previous imaging study cannot be made without actual images for comparison.

2. No abnormal enhancement on postcontrast study.  No meningeal enhancement.

3. Major arteries of circle of Willis and dural venous sinuses are patent.  

Electronically Signed by LOCKLEAR, SAVIO at [id number] [DATE]

## 2023-03-26 ENCOUNTER — Ambulatory Visit (INDEPENDENT_AMBULATORY_CARE_PROVIDER_SITE_OTHER): Payer: BC Managed Care – PPO | Admitting: OTOLARYNGOLOGY

## 2023-03-26 ENCOUNTER — Encounter (INDEPENDENT_AMBULATORY_CARE_PROVIDER_SITE_OTHER): Payer: Self-pay | Admitting: OTOLARYNGOLOGY

## 2023-03-26 ENCOUNTER — Other Ambulatory Visit: Payer: Self-pay

## 2023-03-26 VITALS — Wt 170.0 lb

## 2023-03-26 DIAGNOSIS — H906 Mixed conductive and sensorineural hearing loss, bilateral: Secondary | ICD-10-CM

## 2023-03-26 DIAGNOSIS — H9011 Conductive hearing loss, unilateral, right ear, with unrestricted hearing on the contralateral side: Secondary | ICD-10-CM

## 2023-03-26 DIAGNOSIS — H6993 Unspecified Eustachian tube disorder, bilateral: Secondary | ICD-10-CM

## 2023-03-26 DIAGNOSIS — J3089 Other allergic rhinitis: Secondary | ICD-10-CM

## 2023-03-26 DIAGNOSIS — H6521 Chronic serous otitis media, right ear: Secondary | ICD-10-CM

## 2023-03-26 DIAGNOSIS — Z4589 Encounter for adjustment and management of other implanted devices: Secondary | ICD-10-CM

## 2023-03-26 DIAGNOSIS — H9201 Otalgia, right ear: Secondary | ICD-10-CM

## 2023-03-26 DIAGNOSIS — K219 Gastro-esophageal reflux disease without esophagitis: Secondary | ICD-10-CM

## 2023-03-26 MED ORDER — OFLOXACIN 0.3 % EAR DROPS
4.0000 [drp] | Freq: Two times a day (BID) | OTIC | 0 refills | Status: AC
Start: 2023-03-26 — End: 2023-04-09

## 2023-03-26 NOTE — H&P (Signed)
[facility]  [address]  [place] [address]  Phone: [phone]  Fax: [phone]      Encounter Date: 03/26/2023    Patient ID: Bethany Chavez  MRN: [id number]    DOB: 04/01/1971  Age: 52 y.o. female     Progress Note       Referring Provider:  No ref. provider found    Reason for Visit:   Chief Complaint   Patient presents with    Follow-up After Testing     Here to follow up after audio. States right ear feels full        History of Present Illness:  Bethany Chavez is a 52 y.o. female who is FU on chronic ETD, history of RMT, AR, GERD.  Reports 2 days after her last visit she felt like her right ear is clogged with decreased hearing.  Right ear fullness.  Hearing test was ordered here to follow-up after repeat audiogram.  Increased allergy symptoms    Tympanograms:  AD type B with small canal volume, AS within normal limits    Audiogram shows a right conductive hearing loss that is worse than prior          Patient History:  There is no problem list on file for this patient.    Current Outpatient Medications   Medication Sig    albuterol sulfate (PROVENTIL OR VENTOLIN OR PROAIR) 90 mcg/actuation Inhalation oral inhaler Take 2 Puffs by mouth Every 4 hours as needed    atorvastatin (LIPITOR) 20 mg Oral Tablet TAKE 1 TABLET BY MOUTH EVERY DAY (DIFFERENT BRAND)    azelastine (ASTELIN) 137 mcg (0.1 %) Nasal Aerosol, Spray Administer 2 Sprays into each nostril Twice daily Use in each nostril as directed    cetirizine (ZYRTEC) 10 mg Oral Tablet Take 1 Tablet (10 mg total) by mouth Once a day    EFFER-K 25 mEq Oral Tablet, Effervescent DISSOLVE 1 TABLET IN WATER AND drink TWICE DAILY WITH MEALS    famotidine (PEPCID) 40 mg Oral Tablet TAKE 1 TABLET BY MOUTH EVERY DAY AT BEDTIME FOR 30 DAYS.    gabapentin (NEURONTIN) 100 mg Oral Capsule TAKE 1 CAPSULE BY MOUTH EVERY 8 HOURS - MAY MAKE DROWSY increased dose    glipiZIDE (GLUCOTROL) 5 mg Oral Tablet TAKE 1 TABLET BY MOUTH TWICE DAILY AS  DIRECTED    hydroCHLOROthiazide (HYDRODIURIL) 25 mg Oral Tablet Take 1 Tablet (25 mg total) by mouth Once a day as directed    lansoprazole (PREVACID) 30 mg Oral Capsule, Delayed Release(E.C.) TAKE 1 CAPSULE BY MOUTH EVERY DAY AS DIRECTED FOR 30 DAYS.    metFORMIN (GLUCOPHAGE) 500 mg Oral Tablet TAKE 1 TABLET BY MOUTH TWICE DAILY AS DIRECTED    montelukast (SINGULAIR) 10 mg Oral Tablet TAKE 1 TABLET EVERY DAY AS DIRECTED    ofloxacin (FLOXIN) 0.3 % Otic Drops Otic Solution Administer 4 Drops into the right ear Twice daily for 14 days      Allergies   Allergen Reactions    Amoxicillin     Cephalexin     Cipro [Ciprofloxacin Hcl]     Risamine [Menthol-Zinc Oxide]     Wellbutrin [Bupropion]      Past Medical History:   Diagnosis Date    Asthma     Cancer (CMS HCC)     Diabetes mellitus (CMS HCC)     Esophageal reflux     Migraine      Past Surgical History:   Procedure Laterality  Date    LAPAROSCOPIC CHOLECYSTECTOMY      SKIN CANCER EXCISION       Family Medical History:       Problem Relation (Age of Onset)    Cancer Other    Diabetes Other    Hypertension (High Blood Pressure) Other    Migraines Other    Thyroid Disease Other            Social History     Tobacco Use    Smoking status: Every Day     Current packs/day: 2.00     Average packs/day: 2.0 packs/day for 39.0 years (78.0 ttl pk-yrs)     Types: Cigarettes    Smokeless tobacco: Never   Substance Use Topics    Alcohol use: Not Currently    Drug use: Never       Review of Systems:  Review of Systems    Physical Exam:  Wt 77.1 kg (170 lb)   BMI 33.20 kg/m       Physical Exam  Constitutional:       Appearance: Normal appearance. She is well-developed, well-groomed and normal weight.   HENT:      Head: Normocephalic and atraumatic.      Right Ear: Hearing, ear canal and external ear normal. A middle ear effusion is present. A PE tube is present.      Left Ear: Hearing, ear canal and external ear normal. Tympanic membrane is scarred.      Ears:      Comments: Tube  appears clogged with secretions     Nose: Septal deviation and mucosal edema present.      Right Turbinates: Enlarged.      Left Turbinates: Enlarged.      Mouth/Throat:      Lips: Pink.      Mouth: Mucous membranes are moist.      Pharynx: Oropharynx is clear. Uvula midline.   Eyes:      Extraocular Movements: Extraocular movements intact.   Neck:      Trachea: Phonation normal.   Pulmonary:      Effort: Pulmonary effort is normal.   Musculoskeletal:      Cervical back: Normal range of motion and neck supple.   Lymphadenopathy:      Cervical: No cervical adenopathy.   Skin:     General: Skin is warm.   Neurological:      Mental Status: She is alert and oriented to person, place, and time.      Cranial Nerves: Cranial nerves 2-12 are intact. No facial asymmetry.   Psychiatric:         Attention and Perception: Attention normal.         Mood and Affect: Mood normal.         Speech: Speech normal.         Behavior: Behavior normal. Behavior is cooperative.         Assessment:  ENCOUNTER DIAGNOSES     ICD-10-CM   1. ETD (Eustachian tube dysfunction), bilateral  H69.93   2. Non-seasonal allergic rhinitis due to other allergic trigger  J30.89   3. Tympanostomy tube check  Z45.89   4. Otalgia of right ear  H92.01   5. Right chronic serous otitis media  H65.21   6. Gastroesophageal reflux disease, unspecified whether esophagitis present  K21.9   7. 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  Audiogram and tympanometry reviewed consistent with non patent tube and appears to have another effusion  Tube appears clogged on exam  Rx ofloxacin otic 4 drops b.i.d. times 14 days to the right ear  Continue Astelin, Zyrtec, Singulair daily  Nasal saline b.i.d.  Continue Prevacid and Pepcid daily  Reflux precautions/diet modifications  Orders Placed This Encounter    ofloxacin (FLOXIN) 0.3 % Otic Drops Otic Solution     Follow-up in 3 weeks on a procedure  day for possible removal and replacement of tube if unable to clear the clogged tube      Lonia Farber, DO

## 2023-04-03 ENCOUNTER — Encounter (INDEPENDENT_AMBULATORY_CARE_PROVIDER_SITE_OTHER): Payer: Self-pay | Admitting: OTOLARYNGOLOGY

## 2023-04-17 ENCOUNTER — Encounter (INDEPENDENT_AMBULATORY_CARE_PROVIDER_SITE_OTHER): Payer: Self-pay | Admitting: OTOLARYNGOLOGY

## 2023-07-27 ENCOUNTER — Other Ambulatory Visit (INDEPENDENT_AMBULATORY_CARE_PROVIDER_SITE_OTHER): Payer: Self-pay | Admitting: OTOLARYNGOLOGY

## 2023-07-27 MED ORDER — OFLOXACIN 0.3 % EAR DROPS
5.0000 [drp] | Freq: Two times a day (BID) | OTIC | 0 refills | Status: DC
Start: 2023-07-27 — End: 2023-07-30

## 2023-07-30 ENCOUNTER — Telehealth (INDEPENDENT_AMBULATORY_CARE_PROVIDER_SITE_OTHER): Payer: Self-pay | Admitting: OTOLARYNGOLOGY

## 2023-07-30 ENCOUNTER — Other Ambulatory Visit (INDEPENDENT_AMBULATORY_CARE_PROVIDER_SITE_OTHER): Payer: Self-pay | Admitting: OTOLARYNGOLOGY

## 2023-07-30 MED ORDER — OFLOXACIN 0.3 % EAR DROPS
5.0000 [drp] | Freq: Two times a day (BID) | OTIC | 0 refills | Status: AC
Start: 2023-07-30 — End: 2023-08-06

## 2023-07-30 NOTE — Telephone Encounter (Signed)
Patient called me and told me her ear feels full and she has a tube in it. We did drops and she states she doesn't think the drops are going into the tube. She is aware that you will not be here till next Tuesday, you have 40 on that day as of now.

## 2023-07-30 NOTE — Telephone Encounter (Signed)
Spoke to pt and gave her appt for 08/10/23@8 :45AM per Dr. Quintin Alto, pt stated understanding that if there was a cancellation that she could take it.     11:15 AM

## 2023-08-10 ENCOUNTER — Encounter (INDEPENDENT_AMBULATORY_CARE_PROVIDER_SITE_OTHER): Payer: Self-pay | Admitting: OTOLARYNGOLOGY

## 2023-08-11 ENCOUNTER — Other Ambulatory Visit (INDEPENDENT_AMBULATORY_CARE_PROVIDER_SITE_OTHER): Payer: Self-pay | Admitting: OTOLARYNGOLOGY

## 2023-08-11 MED ORDER — AZITHROMYCIN 250 MG TABLET
ORAL_TABLET | ORAL | 0 refills | Status: AC
Start: 2023-08-11 — End: ?

## 2023-09-01 ENCOUNTER — Ambulatory Visit: Payer: BC Managed Care – PPO | Attending: OTOLARYNGOLOGY | Admitting: OTOLARYNGOLOGY

## 2023-09-01 ENCOUNTER — Encounter (INDEPENDENT_AMBULATORY_CARE_PROVIDER_SITE_OTHER): Payer: Self-pay | Admitting: OTOLARYNGOLOGY

## 2023-09-01 ENCOUNTER — Other Ambulatory Visit: Payer: Self-pay

## 2023-09-01 VITALS — Ht 60.0 in | Wt 170.0 lb

## 2023-09-01 DIAGNOSIS — M26609 Unspecified temporomandibular joint disorder, unspecified side: Secondary | ICD-10-CM | POA: Insufficient documentation

## 2023-09-01 DIAGNOSIS — H6521 Chronic serous otitis media, right ear: Secondary | ICD-10-CM | POA: Insufficient documentation

## 2023-09-01 DIAGNOSIS — Z4589 Encounter for adjustment and management of other implanted devices: Secondary | ICD-10-CM | POA: Insufficient documentation

## 2023-09-01 DIAGNOSIS — H9201 Otalgia, right ear: Secondary | ICD-10-CM | POA: Insufficient documentation

## 2023-09-01 DIAGNOSIS — J3089 Other allergic rhinitis: Secondary | ICD-10-CM | POA: Insufficient documentation

## 2023-09-01 DIAGNOSIS — Z79899 Other long term (current) drug therapy: Secondary | ICD-10-CM | POA: Insufficient documentation

## 2023-09-01 DIAGNOSIS — K219 Gastro-esophageal reflux disease without esophagitis: Secondary | ICD-10-CM | POA: Insufficient documentation

## 2023-09-01 DIAGNOSIS — H6993 Unspecified Eustachian tube disorder, bilateral: Secondary | ICD-10-CM

## 2023-09-01 DIAGNOSIS — Z9622 Myringotomy tube(s) status: Secondary | ICD-10-CM

## 2023-09-01 DIAGNOSIS — F17210 Nicotine dependence, cigarettes, uncomplicated: Secondary | ICD-10-CM | POA: Insufficient documentation

## 2023-09-01 DIAGNOSIS — H6983 Other specified disorders of Eustachian tube, bilateral: Secondary | ICD-10-CM | POA: Insufficient documentation

## 2023-09-01 DIAGNOSIS — E119 Type 2 diabetes mellitus without complications: Secondary | ICD-10-CM | POA: Insufficient documentation

## 2023-09-01 DIAGNOSIS — H906 Mixed conductive and sensorineural hearing loss, bilateral: Secondary | ICD-10-CM | POA: Insufficient documentation

## 2023-09-01 MED ORDER — AZELASTINE 137 MCG (0.1 %) NASAL SPRAY
2.0000 | Freq: Two times a day (BID) | NASAL | 5 refills | Status: AC
Start: 2023-09-01 — End: ?

## 2023-09-01 NOTE — H&P (Signed)
[facility]  [address]  [place] [address]  Operated by [facility]      Name: Bethany Chavez MRN:  [id number]   Date: 09/01/2023 DOB: 27-Aug-1971 (52 y.o.)       Referring Provider:  Donata Clay, FNP    Reason for Visit:   Chief Complaint   Patient presents with    Follow Up 6 Months     RC ears  Per pt RMT (08/01/22) feels clogged and drops are not clearing it.         History of Present Illness:  Bethany Chavez is a 52 y.o. female who is FU on chronic ETD, history of RMT, AR, GERD.  Patient was noted to have a clogged AD tube and was prescribed a 14 day course of drops to try and cleared the tube.  Patient reports she completed the 14 day course of drops however did not feel like the fluid penetrated.  Her ear still feels clogged.  Denies otorrhea.  Stable allergy symptoms    Tympanograms:  AD type B with small canal volume, AS within normal limits      Patient History:  Patient Active Problem List   Diagnosis    Diabetes mellitus (CMS HCC)    GERD (gastroesophageal reflux disease)     Current Outpatient Medications   Medication Sig    albuterol sulfate (PROVENTIL OR VENTOLIN OR PROAIR) 90 mcg/actuation Inhalation oral inhaler Take 2 Puffs by mouth Every 4 hours as needed    atorvastatin (LIPITOR) 20 mg Oral Tablet TAKE 1 TABLET BY MOUTH EVERY DAY (DIFFERENT BRAND)    azelastine (ASTELIN) 137 mcg (0.1 %) Nasal Spray, Non-Aerosol Administer 2 Sprays into each nostril Twice daily Use in each nostril as directed    cetirizine (ZYRTEC) 10 mg Oral Tablet Take 1 Tablet (10 mg total) by mouth Once a day    EFFER-K 25 mEq Oral Tablet, Effervescent DISSOLVE 1 TABLET IN WATER AND drink TWICE DAILY WITH MEALS    famotidine (PEPCID) 40 mg Oral Tablet TAKE 1 TABLET BY MOUTH EVERY DAY AT BEDTIME FOR 30 DAYS.    gabapentin (NEURONTIN) 100 mg Oral Capsule TAKE 1 CAPSULE BY MOUTH EVERY 8 HOURS - MAY MAKE DROWSY increased dose    glipiZIDE (GLUCOTROL) 5 mg Oral Tablet  TAKE 1 TABLET BY MOUTH TWICE DAILY AS DIRECTED    hydroCHLOROthiazide (HYDRODIURIL) 25 mg Oral Tablet Take 1 Tablet (25 mg total) by mouth Once a day as directed    lansoprazole (PREVACID) 30 mg Oral Capsule, Delayed Release(E.C.) TAKE 1 CAPSULE BY MOUTH EVERY DAY AS DIRECTED FOR 30 DAYS.    metFORMIN (GLUCOPHAGE) 500 mg Oral Tablet TAKE 1 TABLET BY MOUTH TWICE DAILY AS DIRECTED    montelukast (SINGULAIR) 10 mg Oral Tablet TAKE 1 TABLET EVERY DAY AS DIRECTED      Allergies   Allergen Reactions    Amoxicillin     Cephalexin     Cipro [Ciprofloxacin Hcl]     Risamine [Menthol-Zinc Oxide]     Wellbutrin [Bupropion]      Past Medical History:   Diagnosis Date    Asthma     Cancer (CMS HCC)     Diabetes mellitus (CMS HCC)     Esophageal reflux     Migraine      Past Surgical History:   Procedure Laterality Date    LAPAROSCOPIC CHOLECYSTECTOMY      SKIN CANCER EXCISION  Family Medical History:       Problem Relation (Age of Onset)    Cancer Other    Diabetes Other    Hypertension (High Blood Pressure) Other    Migraines Other    Thyroid Disease Other            Social History     Tobacco Use    Smoking status: Every Day     Current packs/day: 2.00     Average packs/day: 2.0 packs/day for 39.0 years (78.0 ttl pk-yrs)     Types: Cigarettes    Smokeless tobacco: Never   Substance Use Topics    Alcohol use: Not Currently    Drug use: Never       Review of Systems:  Review of Systems    Physical Exam:  Ht 1.524 m (5')   Wt 77.1 kg (170 lb)   BMI 33.20 kg/m       Physical Exam  Constitutional:       Appearance: Normal appearance. She is well-developed, well-groomed and normal weight.   HENT:      Head: Normocephalic and atraumatic.      Right Ear: Hearing, ear canal and external ear normal. A middle ear effusion is present.      Left Ear: Hearing, ear canal and external ear normal. Tympanic membrane is scarred.      Ears:      Comments: Tube extruded     Nose: Septal deviation and mucosal edema present.      Right  Turbinates: Enlarged.      Left Turbinates: Enlarged.      Mouth/Throat:      Lips: Pink.      Mouth: Mucous membranes are moist.      Pharynx: Oropharynx is clear. Uvula midline.   Eyes:      Extraocular Movements: Extraocular movements intact.   Neck:      Trachea: Phonation normal.   Pulmonary:      Effort: Pulmonary effort is normal.   Musculoskeletal:      Cervical back: Normal range of motion and neck supple.   Lymphadenopathy:      Cervical: No cervical adenopathy.   Skin:     General: Skin is warm.   Neurological:      Mental Status: She is alert and oriented to person, place, and time.      Cranial Nerves: Cranial nerves 2-12 are intact. No facial asymmetry.   Psychiatric:         Attention and Perception: Attention normal.         Mood and Affect: Mood normal.         Speech: Speech normal.         Behavior: Behavior normal. Behavior is cooperative.          Assessment:  ENCOUNTER DIAGNOSES     ICD-10-CM   1. Right chronic serous otitis media  H65.21   2. Otalgia of right ear  H92.01   3. ETD (Eustachian tube dysfunction), bilateral  H69.93   4. Non-seasonal allergic rhinitis due to other allergic trigger  J30.89   5. Gastroesophageal reflux disease, unspecified whether esophagitis present  K21.9   6. 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 TMJ (temporomandibular joint disorder)  M26.609   8. Tympanostomy tube check  Z45.89       Plan:  Medical records reviewed on 09/01/2023.  tympanometry reviewed consistent with right serous effusion  Plan right  myringotomy with tube placement on next procedure day,  risks, benefits, alternatives discussed at length.  All questions answered.  Patient wishes to proceed.  Continue Astelin, Zyrtec, Singulair daily  Nasal saline b.i.d.  Continue Prevacid and Pepcid daily  Reflux precautions/diet modifications  Orders Placed This Encounter    16109 - NASAL ENDOSCOPY DIAGNOSTIC UNILATERAL OR BILATERAL (AMB ONLY)    POCT  HEARING/VISION/TYMPANOGRAM (AMB ONLY)    azelastine (ASTELIN) 137 mcg (0.1 %) Nasal Spray, Non-Aerosol     Follow-up on next procedure day for RMT    Lonia Farber, DO  ENT / Facial Plastic Surgery    I appreciate the opportunity to be involved in the care of your patients.  If you have any questions or concerns regarding this encounter, please do not hesitate to contact me at your convenience.        This note may have been partially generated using MModal Fluency Direct system, and there may be some incorrect words, spellings, and punctuation that were not noted in checking the note before saving, though effort was made to avoid such errors.

## 2023-09-01 NOTE — Procedures (Signed)
[facility]  [address]  [place] [address]    Procedure Note    Name: Bethany Chavez MRN:  [id number]   Date: 09/01/2023 Age: 52 y.o.       [address] - NASAL ENDOSCOPY DIAGNOSTIC UNILATERAL OR BILATERAL (AMB ONLY)    Performed by: Lonia Farber, DO  Authorized by: Lonia Farber, DO    Time Out:     Immediately before the procedure, a time out was called:  Yes    Patient verified:  Yes    Procedure Verified:  Yes    Site Verified:  Yes    Indications for procedure: Otalgia and Unilateral middle ear effusion    Anesthesia: Oxymetazoline nasal spray    Description: Nasal endoscopy with rigid scope was performed with examination of the  septum, inferior, middle, and superior meatus, turbinates, sphenoethmoidal recess, and nasopharynx.     There were no polyps, pus, or granulation tissue noted.  ET orifices and nasopharynx were normal.     Findings: Septal deviation, Inferior turbinate hypertrophy, and Allergic rhinitis    The patient tolerated the procedure well.      Lonia Farber, DO

## 2023-09-10 ENCOUNTER — Encounter (INDEPENDENT_AMBULATORY_CARE_PROVIDER_SITE_OTHER): Payer: Self-pay | Admitting: OTOLARYNGOLOGY

## 2023-09-10 ENCOUNTER — Ambulatory Visit: Payer: BC Managed Care – PPO | Attending: OTOLARYNGOLOGY | Admitting: OTOLARYNGOLOGY

## 2023-09-10 ENCOUNTER — Telehealth (INDEPENDENT_AMBULATORY_CARE_PROVIDER_SITE_OTHER): Payer: Self-pay | Admitting: OTOLARYNGOLOGY

## 2023-09-10 ENCOUNTER — Other Ambulatory Visit: Payer: Self-pay

## 2023-09-10 VITALS — Ht 60.0 in | Wt 170.0 lb

## 2023-09-10 DIAGNOSIS — H6983 Other specified disorders of Eustachian tube, bilateral: Secondary | ICD-10-CM | POA: Insufficient documentation

## 2023-09-10 DIAGNOSIS — M26609 Unspecified temporomandibular joint disorder, unspecified side: Secondary | ICD-10-CM | POA: Insufficient documentation

## 2023-09-10 DIAGNOSIS — H906 Mixed conductive and sensorineural hearing loss, bilateral: Secondary | ICD-10-CM | POA: Insufficient documentation

## 2023-09-10 DIAGNOSIS — H6993 Unspecified Eustachian tube disorder, bilateral: Secondary | ICD-10-CM | POA: Insufficient documentation

## 2023-09-10 DIAGNOSIS — H9201 Otalgia, right ear: Secondary | ICD-10-CM | POA: Insufficient documentation

## 2023-09-10 DIAGNOSIS — H6521 Chronic serous otitis media, right ear: Secondary | ICD-10-CM | POA: Insufficient documentation

## 2023-09-10 DIAGNOSIS — K219 Gastro-esophageal reflux disease without esophagitis: Secondary | ICD-10-CM | POA: Insufficient documentation

## 2023-09-10 DIAGNOSIS — Z79899 Other long term (current) drug therapy: Secondary | ICD-10-CM | POA: Insufficient documentation

## 2023-09-10 DIAGNOSIS — H6991 Unspecified Eustachian tube disorder, right ear: Secondary | ICD-10-CM

## 2023-09-10 DIAGNOSIS — F17210 Nicotine dependence, cigarettes, uncomplicated: Secondary | ICD-10-CM | POA: Insufficient documentation

## 2023-09-10 DIAGNOSIS — Z9622 Myringotomy tube(s) status: Secondary | ICD-10-CM

## 2023-09-10 DIAGNOSIS — J3089 Other allergic rhinitis: Secondary | ICD-10-CM | POA: Insufficient documentation

## 2023-09-10 DIAGNOSIS — H9191 Unspecified hearing loss, right ear: Secondary | ICD-10-CM

## 2023-09-10 DIAGNOSIS — M26602 Left temporomandibular joint disorder, unspecified: Secondary | ICD-10-CM

## 2023-09-10 MED ORDER — OFLOXACIN 0.3 % EAR DROPS
4.0000 [drp] | Freq: Two times a day (BID) | OTIC | 0 refills | Status: AC
Start: 2023-09-10 — End: 2023-09-17

## 2023-09-10 NOTE — Procedures (Signed)
[facility]  [address]  [place] [address]  Operated by [facility]  Procedure Note    Name: Bethany Chavez MRN:  [id number]   Date: 09/10/2023 DOB:  06/30/1971 (52 y.o.)         [phone] - TYMPANOSTOMY (REQUIRING INSERTION OF VENTILATING TUBE), LOCAL/TOPICAL ANESTHESIA (AMB ONLY)    Performed by: Lonia Farber, DO  Authorized by: Lonia Farber, DO    Time Out:     Immediately before the procedure, a time out was called:  Yes    Patient verified:  Yes    Procedure Verified:  Yes    Site Verified:  Yes  Preoperative Diagnosis:  Right eustachian tube dysfunction, right chronic otitis media      Postoperative Diagnoses:  Same     Procedure Performed:  Right myringotomy with tube placement     Attending Surgeon: Lonia Farber, DO      Anesthesia Type:  Topical/local     Estimated Blood Loss:  Minimal     Ventilation tube type:U Tube      Complications:  None immediate        Description of Procedure:  After informed consent was reviewed and confirmed. Patient was prepped and draped in a standard semi-sterile ENT fashion. With the aid of a binocular microscope the right external auditory canal was examined after placement of a ear speculum, a cerumen curette was used to debride obstructing cerumen. The visualized tympanic membrane revealed findings consistent with the after mentioned diagnosis.  Topical phenol was applied to the myringotomy site on the tympanic membrane.  An anterior inferior radial incision was made using a myringotomy knife. Middle ear fluid was suctioned free with a #3 suction. The myringotomy tube was placed within the myringotomy incision with the alligator forceps. Floxin otic drops were instilled into the EAC, middle ear penetration was aided with a tragal pump. A cottonball was then placed into the EAC.  Patient tolerated procedure well           Lonia Farber, DO

## 2023-09-10 NOTE — H&P (Signed)
[facility]  [address]  [place] [address]  Operated by [facility]      Name: Bethany Chavez MRN:  [id number]   Date: 09/10/2023 DOB: [birth year] (52 y.o.)       Referring Provider:  No ref. provider found    Reason for Visit:   Chief Complaint   Patient presents with    Ear Problem(s)     RMT        History of Present Illness:  Bethany Chavez is a 52 y.o. female who is FU on chronic ETD, history of RMT, AR, GERD.  Here for recheck and possible tube placement.  Her previously placed right tube has been clogged she has tried a 14 course of otic drops that were unsuccessful at on clogging.  Her ear still feels clogged with decreased hearing.    Denies otorrhea.  Stable allergy symptoms          Patient History:  Patient Active Problem List   Diagnosis    Diabetes mellitus (CMS HCC)    GERD (gastroesophageal reflux disease)     Current Outpatient Medications   Medication Sig    albuterol sulfate (PROVENTIL OR VENTOLIN OR PROAIR) 90 mcg/actuation Inhalation oral inhaler Take 2 Puffs by mouth Every 4 hours as needed    atorvastatin (LIPITOR) 20 mg Oral Tablet TAKE 1 TABLET BY MOUTH EVERY DAY (DIFFERENT BRAND)    azelastine (ASTELIN) 137 mcg (0.1 %) Nasal Spray, Non-Aerosol Administer 2 Sprays into each nostril Twice daily Use in each nostril as directed    cetirizine (ZYRTEC) 10 mg Oral Tablet Take 1 Tablet (10 mg total) by mouth Once a day    chlorhexidine gluconate (PERIDEX) 0.12 % Mucous Membrane Mouthwash RINSE WITH 1/2 OUNCE (15 ML) TWICE DAILY AFTER BREAKFAST AND BEFORE BEDTIME FOLLOWING BRUSHING - DO NOT SWALLOW    EFFER-K 25 mEq Oral Tablet, Effervescent DISSOLVE 1 TABLET IN WATER AND drink TWICE DAILY WITH MEALS    famotidine (PEPCID) 40 mg Oral Tablet TAKE 1 TABLET BY MOUTH EVERY DAY AT BEDTIME FOR 30 DAYS.    gabapentin (NEURONTIN) 100 mg Oral Capsule TAKE 1 CAPSULE BY MOUTH EVERY 8 HOURS - MAY MAKE DROWSY increased dose    glipiZIDE (GLUCOTROL) 5 mg  Oral Tablet TAKE 1 TABLET BY MOUTH TWICE DAILY AS DIRECTED    hydroCHLOROthiazide (HYDRODIURIL) 25 mg Oral Tablet Take 1 Tablet (25 mg total) by mouth Once a day as directed    lansoprazole (PREVACID) 30 mg Oral Capsule, Delayed Release(E.C.) TAKE 1 CAPSULE BY MOUTH EVERY DAY AS DIRECTED FOR 30 DAYS.    metFORMIN (GLUCOPHAGE) 500 mg Oral Tablet TAKE 1 TABLET BY MOUTH TWICE DAILY AS DIRECTED    montelukast (SINGULAIR) 10 mg Oral Tablet TAKE 1 TABLET EVERY DAY AS DIRECTED    ofloxacin (FLOXIN) 0.3 % Otic Drops Otic Solution Administer 4 Drops into the right ear Twice daily for 7 days      Allergies   Allergen Reactions    Bupropion Hcl Anaphylaxis    Sulfamethoxazole-Trimethoprim Diarrhea, Shortness of Breath and Hives/ Urticaria    Amoxicillin     Cephalexin     Cipro [Ciprofloxacin Hcl]     Risamine [Menthol-Zinc Oxide]     Wellbutrin [Bupropion]      Past Medical History:   Diagnosis Date    Asthma     Cancer (CMS HCC)     Diabetes mellitus (CMS HCC)     Esophageal reflux  Migraine      Past Surgical History:   Procedure Laterality Date    LAPAROSCOPIC CHOLECYSTECTOMY      SKIN CANCER EXCISION       Family Medical History:       Problem Relation (Age of Onset)    Cancer Other    Diabetes Other    Hypertension (High Blood Pressure) Other    Migraines Other    Thyroid Disease Other            Social History     Tobacco Use    Smoking status: Every Day     Current packs/day: 2.00     Average packs/day: 2.0 packs/day for 39.0 years (78.0 ttl pk-yrs)     Types: Cigarettes    Smokeless tobacco: Never   Substance Use Topics    Alcohol use: Not Currently    Drug use: Never       Review of Systems:  Review of Systems    Physical Exam:  Ht 1.524 m (5')   Wt 77.1 kg (170 lb)   BMI 33.20 kg/m       Physical Exam  Constitutional:       Appearance: Normal appearance. She is well-developed, well-groomed and normal weight.   HENT:      Head: Normocephalic and atraumatic.      Right Ear: Hearing, ear canal and external ear  normal. A middle ear effusion is present.      Left Ear: Hearing, ear canal and external ear normal. Tympanic membrane is scarred.      Nose: Septal deviation and mucosal edema present.      Right Turbinates: Enlarged.      Left Turbinates: Enlarged.      Mouth/Throat:      Lips: Pink.      Mouth: Mucous membranes are moist.      Pharynx: Oropharynx is clear. Uvula midline.   Eyes:      Extraocular Movements: Extraocular movements intact.   Neck:      Trachea: Phonation normal.   Pulmonary:      Effort: Pulmonary effort is normal.   Musculoskeletal:      Cervical back: Normal range of motion and neck supple.   Lymphadenopathy:      Cervical: No cervical adenopathy.   Skin:     General: Skin is warm.   Neurological:      Mental Status: She is alert and oriented to person, place, and time.      Cranial Nerves: Cranial nerves 2-12 are intact. No facial asymmetry.   Psychiatric:         Attention and Perception: Attention normal.         Mood and Affect: Mood normal.         Speech: Speech normal.         Behavior: Behavior normal. Behavior is cooperative.          Assessment:  ENCOUNTER DIAGNOSES     ICD-10-CM   1. ETD (Eustachian tube dysfunction), bilateral  H69.93   2. Right chronic serous otitis media  H65.21   3. Otalgia of right ear  H92.01   4. Non-seasonal allergic rhinitis due to other allergic trigger  J30.89   5. 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 Gastroesophageal reflux disease, unspecified whether esophagitis present  K21.9   7. TMJ (temporomandibular joint disorder)  M26.609         Plan:  Medical records  reviewed on 09/10/2023.  Right myringotomy with tube placement performed  Postprocedure instructions reviewed  Rx ofloxacin otic 4 drops b.i.d. x7 days to the right ear  Order audiogram prior to next visit  Continue Astelin, Zyrtec, Singulair daily  Nasal saline b.i.d.  Continue Prevacid and Pepcid daily  Reflux precautions/diet modifications  Orders Placed This  Encounter    [phone] - TYMPANOSTOMY (REQUIRING INSERTION OF VENTILATING TUBE), LOCAL/TOPICAL ANESTHESIA (AMB ONLY)    [facility]    ofloxacin (FLOXIN) 0.3 % Otic Drops Otic Solution     Follow-up in 3-4 months for review of audiogram and recheck or sooner PRN    Lonia Farber, DO  ENT / Facial Plastic Surgery    I appreciate the opportunity to be involved in the care of your patients.  If you have any questions or concerns regarding this encounter, please do not hesitate to contact me at your convenience.        This note may have been partially generated using MModal Fluency Direct system, and there may be some incorrect words, spellings, and punctuation that were not noted in checking the note before saving, though effort was made to avoid such errors.

## 2023-09-10 NOTE — Telephone Encounter (Signed)
Left VM for pt for audio on 11/27/23 @8AM  and will see patient after same day.     LNT    Will mail out.

## 2023-11-02 DIAGNOSIS — R590 Localized enlarged lymph nodes: Secondary | ICD-10-CM | POA: Insufficient documentation

## 2023-11-27 ENCOUNTER — Ambulatory Visit: Payer: BC Managed Care – PPO | Attending: OTOLARYNGOLOGY | Admitting: OTOLARYNGOLOGY

## 2023-11-27 ENCOUNTER — Other Ambulatory Visit: Payer: Self-pay

## 2023-11-27 ENCOUNTER — Encounter (INDEPENDENT_AMBULATORY_CARE_PROVIDER_SITE_OTHER): Payer: Self-pay | Admitting: OTOLARYNGOLOGY

## 2023-11-27 VITALS — Ht 60.0 in | Wt 170.0 lb

## 2023-11-27 DIAGNOSIS — H9201 Otalgia, right ear: Secondary | ICD-10-CM | POA: Insufficient documentation

## 2023-11-27 DIAGNOSIS — K219 Gastro-esophageal reflux disease without esophagitis: Secondary | ICD-10-CM | POA: Insufficient documentation

## 2023-11-27 DIAGNOSIS — H6993 Unspecified Eustachian tube disorder, bilateral: Secondary | ICD-10-CM | POA: Insufficient documentation

## 2023-11-27 DIAGNOSIS — Z4589 Encounter for adjustment and management of other implanted devices: Secondary | ICD-10-CM | POA: Insufficient documentation

## 2023-11-27 DIAGNOSIS — F17210 Nicotine dependence, cigarettes, uncomplicated: Secondary | ICD-10-CM

## 2023-11-27 DIAGNOSIS — J3089 Other allergic rhinitis: Secondary | ICD-10-CM | POA: Insufficient documentation

## 2023-11-27 DIAGNOSIS — M26609 Unspecified temporomandibular joint disorder, unspecified side: Secondary | ICD-10-CM | POA: Insufficient documentation

## 2023-11-27 NOTE — H&P (Signed)
[facility]  [address]  [place] [address]  Operated by [facility]      Name: Bethany Chavez MRN:  [id number]   Date: 11/27/2023 DOB: 1970/12/27 (52 y.o.)       Referring Provider:  Donata Clay, FNP    Reason for Visit:   Chief Complaint   Patient presents with    Follow-up After Testing     Audio- 11/27/23        History of Present Illness:  Bethany Chavez is a 52 y.o. female who is FU on chronic ETD, history of RMT, AR, GERD.  Doing well since last tube was placed.    Denies otorrhea.  Stable allergy symptoms.  Here to review recent audiogram.  Ears much improved since tube placement.                    Patient History:  Patient Active Problem List   Diagnosis    Diabetes mellitus (CMS HCC)    GERD (gastroesophageal reflux disease)    Anterior cervical lymphadenopathy     Current Outpatient Medications   Medication Sig    albuterol sulfate (PROVENTIL OR VENTOLIN OR PROAIR) 90 mcg/actuation Inhalation oral inhaler Take 2 Puffs by mouth Every 4 hours as needed    azelastine (ASTELIN) 137 mcg (0.1 %) Nasal Spray, Non-Aerosol Administer 2 Sprays into each nostril Twice daily Use in each nostril as directed    cetirizine (ZYRTEC) 10 mg Oral Tablet Take 1 Tablet (10 mg total) by mouth Once a day    chlorhexidine gluconate (PERIDEX) 0.12 % Mucous Membrane Mouthwash RINSE WITH 1/2 OUNCE (15 ML) TWICE DAILY AFTER BREAKFAST AND BEFORE BEDTIME FOLLOWING BRUSHING - DO NOT SWALLOW    EFFER-K 25 mEq Oral Tablet, Effervescent DISSOLVE 1 TABLET IN WATER AND drink TWICE DAILY WITH MEALS    famotidine (PEPCID) 40 mg Oral Tablet TAKE 1 TABLET BY MOUTH EVERY DAY AT BEDTIME FOR 30 DAYS.    gabapentin (NEURONTIN) 100 mg Oral Capsule TAKE 1 CAPSULE BY MOUTH EVERY 8 HOURS - MAY MAKE DROWSY increased dose    glipiZIDE (GLUCOTROL) 5 mg Oral Tablet TAKE 1 TABLET BY MOUTH TWICE DAILY AS DIRECTED    hydroCHLOROthiazide (HYDRODIURIL) 25 mg Oral Tablet Take 1 Tablet (25 mg total)  by mouth Once a day as directed    lansoprazole (PREVACID) 30 mg Oral Capsule, Delayed Release(E.C.) TAKE 1 CAPSULE BY MOUTH EVERY DAY AS DIRECTED FOR 30 DAYS.    metFORMIN (GLUCOPHAGE) 500 mg Oral Tablet TAKE 1 TABLET BY MOUTH TWICE DAILY AS DIRECTED    montelukast (SINGULAIR) 10 mg Oral Tablet TAKE 1 TABLET EVERY DAY AS DIRECTED    rosuvastatin (CRESTOR) 5 mg Oral Tablet Take 1 Tablet (5 mg total) by mouth Every evening      Allergies   Allergen Reactions    Bupropion Hcl Anaphylaxis    Sulfamethoxazole-Trimethoprim Diarrhea, Shortness of Breath and Hives/ Urticaria    Amoxicillin     Cephalexin     Cipro [Ciprofloxacin Hcl]     Risamine [Menthol-Zinc Oxide]     Wellbutrin [Bupropion]      Past Medical History:   Diagnosis Date    Asthma     Cancer (CMS HCC)     Diabetes mellitus (CMS HCC)     Esophageal reflux     Migraine      Past Surgical History:   Procedure Laterality Date    LAPAROSCOPIC CHOLECYSTECTOMY  SKIN CANCER EXCISION       Family Medical History:       Problem Relation (Age of Onset)    Cancer Other    Diabetes Other    Hypertension (High Blood Pressure) Other    Migraines Other    Thyroid Disease Other            Social History     Tobacco Use    Smoking status: Every Day     Current packs/day: 2.00     Average packs/day: 2.0 packs/day for 39.0 years (78.0 ttl pk-yrs)     Types: Cigarettes    Smokeless tobacco: Never   Substance Use Topics    Alcohol use: Not Currently    Drug use: Never       Review of Systems:  Review of Systems    Physical Exam:  Ht 1.524 m (5')   Wt 77.1 kg (170 lb)   BMI 33.20 kg/m       Physical Exam  Constitutional:       Appearance: Normal appearance. She is well-developed, well-groomed and normal weight.   HENT:      Head: Normocephalic and atraumatic.      Right Ear: Hearing, ear canal and external ear normal. A PE tube is present.      Left Ear: Hearing, ear canal and external ear normal. Tympanic membrane is scarred.      Nose: Septal deviation and mucosal edema  present.      Right Turbinates: Enlarged.      Left Turbinates: Enlarged.      Mouth/Throat:      Lips: Pink.      Mouth: Mucous membranes are moist.      Pharynx: Oropharynx is clear. Uvula midline.   Eyes:      Extraocular Movements: Extraocular movements intact.   Neck:      Trachea: Phonation normal.   Pulmonary:      Effort: Pulmonary effort is normal.   Musculoskeletal:      Cervical back: Normal range of motion and neck supple.   Lymphadenopathy:      Cervical: No cervical adenopathy.   Skin:     General: Skin is warm.   Neurological:      Mental Status: She is alert and oriented to person, place, and time.      Cranial Nerves: Cranial nerves 2-12 are intact. No facial asymmetry.   Psychiatric:         Attention and Perception: Attention normal.         Mood and Affect: Mood normal.         Speech: Speech normal.         Behavior: Behavior normal. Behavior is cooperative.          Assessment:  ENCOUNTER DIAGNOSES     ICD-10-CM   1. ETD (Eustachian tube dysfunction), bilateral  H69.93   2. Non-seasonal allergic rhinitis due to other allergic trigger  J30.89   3. Tympanostomy tube check  Z45.89   4. Gastroesophageal reflux disease, unspecified whether esophagitis present  K21.9   5. TMJ (temporomandibular joint disorder)  M26.609   6. Otalgia of right ear  H92.01           Plan:  Medical records reviewed on 11/27/2023.  Audiogram and tympanometry reviewed and interpreted  R TIPP  Continue Astelin, Zyrtec, Singulair daily  Nasal saline b.i.d.  Continue Prevacid and Pepcid daily  Reflux precautions/diet modifications  No orders of the defined  types were placed in this encounter.    Follow-up in 4-6 months  or sooner PRN    Lonia Farber, DO  ENT / Facial Plastic Surgery    I appreciate the opportunity to be involved in the care of your patients.  If you have any questions or concerns regarding this encounter, please do not hesitate to contact me at your convenience.        This note may have been partially  generated using MModal Fluency Direct system, and there may be some incorrect words, spellings, and punctuation that were not noted in checking the note before saving, though effort was made to avoid such errors.

## 2024-05-31 ENCOUNTER — Ambulatory Visit (INDEPENDENT_AMBULATORY_CARE_PROVIDER_SITE_OTHER): Payer: Self-pay | Admitting: OTOLARYNGOLOGY

## 2024-09-27 ENCOUNTER — Encounter (INDEPENDENT_AMBULATORY_CARE_PROVIDER_SITE_OTHER): Payer: Self-pay | Admitting: OTOLARYNGOLOGY

## 2024-09-27 ENCOUNTER — Ambulatory Visit: Payer: Self-pay | Admitting: OTOLARYNGOLOGY

## 2024-09-27 ENCOUNTER — Other Ambulatory Visit: Payer: Self-pay

## 2024-09-27 VITALS — Ht 60.0 in | Wt 170.0 lb

## 2024-09-27 DIAGNOSIS — K219 Gastro-esophageal reflux disease without esophagitis: Secondary | ICD-10-CM | POA: Insufficient documentation

## 2024-09-27 DIAGNOSIS — Z4589 Encounter for adjustment and management of other implanted devices: Secondary | ICD-10-CM | POA: Insufficient documentation

## 2024-09-27 DIAGNOSIS — H6993 Unspecified Eustachian tube disorder, bilateral: Secondary | ICD-10-CM | POA: Insufficient documentation

## 2024-09-27 DIAGNOSIS — Z9622 Myringotomy tube(s) status: Secondary | ICD-10-CM

## 2024-09-27 DIAGNOSIS — J3089 Other allergic rhinitis: Secondary | ICD-10-CM | POA: Insufficient documentation

## 2024-09-27 DIAGNOSIS — H9201 Otalgia, right ear: Secondary | ICD-10-CM | POA: Insufficient documentation

## 2024-09-27 DIAGNOSIS — M26609 Unspecified temporomandibular joint disorder, unspecified side: Secondary | ICD-10-CM | POA: Insufficient documentation

## 2024-09-27 DIAGNOSIS — J309 Allergic rhinitis, unspecified: Secondary | ICD-10-CM

## 2024-09-27 NOTE — H&P (Signed)
 [facility]  [address]  [place] [address]  Operated by [facility]      Name: Bethany Chavez MRN:  [id number]   Date: 09/27/2024 DOB: 10/25/1971 (53 y.o.)       Referring Provider:  Vannie Olam Siad, FNP    Reason for Visit:   Chief Complaint   Patient presents with    Follow Up 6 Months     11/27/2023 LOV eustachian tubes dysfunction bilateral   No complaints         History of Present Illness:  Bethany Chavez is a 53 y.o. female who presents for follow-up on chronic ETD, history of RMT, AR, GERD.  Doing well.  Denies otorrhea.  Stable allergy symptoms.      In office tympanograms:  AS type C, AD type B with large canal volume (my interpretation)      Patient History:  Patient Active Problem List   Diagnosis    Diabetes mellitus    GERD (gastroesophageal reflux disease)    Anterior cervical lymphadenopathy     Current Outpatient Medications   Medication Sig    albuterol sulfate (PROVENTIL OR VENTOLIN OR PROAIR) 90 mcg/actuation Inhalation oral inhaler Take 2 Puffs by mouth Every 4 hours as needed    azelastine  (ASTELIN ) 137 mcg (0.1 %) Nasal Spray, Non-Aerosol Administer 2 Sprays into each nostril Twice daily Use in each nostril as directed    cetirizine (ZYRTEC) 10 mg Oral Tablet Take 1 Tablet (10 mg total) by mouth Once a day    chlorhexidine gluconate (PERIDEX) 0.12 % Mucous Membrane Mouthwash RINSE WITH 1/2 OUNCE (15 ML) TWICE DAILY AFTER BREAKFAST AND BEFORE BEDTIME FOLLOWING BRUSHING - DO NOT SWALLOW    EFFER-K 25 mEq Oral Tablet, Effervescent DISSOLVE 1 TABLET IN WATER AND drink TWICE DAILY WITH MEALS    famotidine (PEPCID) 40 mg Oral Tablet TAKE 1 TABLET BY MOUTH EVERY DAY AT BEDTIME FOR 30 DAYS.    gabapentin (NEURONTIN) 100 mg Oral Capsule TAKE 1 CAPSULE BY MOUTH EVERY 8 HOURS - MAY MAKE DROWSY increased dose    glipiZIDE (GLUCOTROL) 5 mg Oral Tablet TAKE 1 TABLET BY MOUTH TWICE DAILY AS DIRECTED    hydroCHLOROthiazide (HYDRODIURIL) 25 mg Oral  Tablet Take 1 Tablet (25 mg total) by mouth Once a day as directed    lansoprazole (PREVACID) 30 mg Oral Capsule, Delayed Release(E.C.) TAKE 1 CAPSULE BY MOUTH EVERY DAY AS DIRECTED FOR 30 DAYS.    metFORMIN (GLUCOPHAGE) 500 mg Oral Tablet TAKE 1 TABLET BY MOUTH TWICE DAILY AS DIRECTED    montelukast (SINGULAIR) 10 mg Oral Tablet TAKE 1 TABLET EVERY DAY AS DIRECTED    rosuvastatin (CRESTOR) 5 mg Oral Tablet Take 1 Tablet (5 mg total) by mouth Every evening      Allergies   Allergen Reactions    Bupropion Hcl Anaphylaxis    Sulfamethoxazole-Trimethoprim Diarrhea, Shortness of Breath and Hives/ Urticaria    Amoxicillin     Cephalexin     Cipro [Ciprofloxacin Hcl]     Risamine [Menthol-Zinc Oxide]     Wellbutrin [Bupropion]      Past Medical History:   Diagnosis Date    Asthma     Cancer (CMS HCC)     Diabetes mellitus     Esophageal reflux     Migraine      Past Surgical History:   Procedure Laterality Date    LAPAROSCOPIC CHOLECYSTECTOMY      SKIN CANCER EXCISION  Family Medical History:       Problem Relation (Age of Onset)    Cancer Other    Diabetes Other    Hypertension (High Blood Pressure) Other    Migraines Other    Thyroid Disease Other            Social History     Tobacco Use    Smoking status: Every Day     Current packs/day: 2.00     Average packs/day: 2.0 packs/day for 39.0 years (78.0 ttl pk-yrs)     Types: Cigarettes    Smokeless tobacco: Never   Substance Use Topics    Alcohol use: Not Currently    Drug use: Never       Review of Systems:  Review of Systems    Physical Exam:  Ht 1.524 m (5')   Wt 77.1 kg (170 lb)   BMI 33.20 kg/m       Physical Exam  Constitutional:       Appearance: Normal appearance. She is well-developed, well-groomed and normal weight.   HENT:      Head: Normocephalic and atraumatic.      Right Ear: Hearing, ear canal and external ear normal. A PE tube is present.      Left Ear: Hearing, ear canal and external ear normal. Tympanic membrane is scarred.      Nose: Septal  deviation and mucosal edema present.      Right Turbinates: Enlarged.      Left Turbinates: Enlarged.      Mouth/Throat:      Lips: Pink.      Mouth: Mucous membranes are moist.      Pharynx: Oropharynx is clear. Uvula midline.   Eyes:      Extraocular Movements: Extraocular movements intact.   Neck:      Trachea: Phonation normal.   Pulmonary:      Effort: Pulmonary effort is normal.   Musculoskeletal:      Cervical back: Normal range of motion and neck supple.   Lymphadenopathy:      Cervical: No cervical adenopathy.   Skin:     General: Skin is warm.   Neurological:      Mental Status: She is alert and oriented to person, place, and time.      Cranial Nerves: Cranial nerves 2-12 are intact. No facial asymmetry.   Psychiatric:         Attention and Perception: Attention normal.         Mood and Affect: Mood normal.         Speech: Speech normal.         Behavior: Behavior normal. Behavior is cooperative.          Assessment:  ENCOUNTER DIAGNOSES     ICD-10-CM   1. ETD (Eustachian tube dysfunction), bilateral  H69.93   2. Non-seasonal allergic rhinitis due to other allergic trigger  J30.89   3. Tympanostomy tube check  Z45.89   4. Gastroesophageal reflux disease, unspecified whether esophagitis present  K21.9   5. TMJ (temporomandibular joint disorder)  M26.609   6. Otalgia of right ear  H92.01             Plan:  Medical records reviewed on 09/27/2024.  In office tympanograms reviewed and interpreted  R TIPP  2.Continue Astelin , Zyrtec, Singulair daily  Nasal saline b.i.d.  4.Continue Prevacid and Pepcid daily  Reflux precautions/diet modifications  Orders Placed This Encounter    POCT HEARING/VISION/TYMPANOGRAM (AMB ONLY)  Follow-up in 4-6 months  or sooner PRN    Oneil Alvine, DO  ENT / Facial Plastic Surgery    I appreciate the opportunity to be involved in the care of your patients.  If you have any questions or concerns regarding this encounter, please do not hesitate to contact me at your convenience.         This note may have been partially generated using MModal Fluency Direct system, and there may be some incorrect words, spellings, and punctuation that were not noted in checking the note before saving, though effort was made to avoid such errors.

## 2025-04-05 ENCOUNTER — Ambulatory Visit (INDEPENDENT_AMBULATORY_CARE_PROVIDER_SITE_OTHER): Payer: Self-pay | Admitting: OTOLARYNGOLOGY
